# Patient Record
Sex: Male | Born: 1945 | Race: White | Hispanic: No | Smoking: Current every day smoker
Health system: Southern US, Community
[De-identification: ages and names within clinical notes are randomized; demographics above are authoritative.]

## PROBLEM LIST (undated history)

## (undated) DIAGNOSIS — S0990XA Unspecified injury of head, initial encounter: Secondary | ICD-10-CM

## (undated) DIAGNOSIS — J189 Pneumonia, unspecified organism: Secondary | ICD-10-CM

## (undated) DIAGNOSIS — J449 Chronic obstructive pulmonary disease, unspecified: Secondary | ICD-10-CM

## (undated) DIAGNOSIS — N400 Enlarged prostate without lower urinary tract symptoms: Secondary | ICD-10-CM

## (undated) DIAGNOSIS — F419 Anxiety disorder, unspecified: Secondary | ICD-10-CM

## (undated) DIAGNOSIS — I2699 Other pulmonary embolism without acute cor pulmonale: Secondary | ICD-10-CM

## (undated) DIAGNOSIS — I2510 Atherosclerotic heart disease of native coronary artery without angina pectoris: Secondary | ICD-10-CM

## (undated) DIAGNOSIS — D332 Benign neoplasm of brain, unspecified: Secondary | ICD-10-CM

## (undated) DIAGNOSIS — I10 Essential (primary) hypertension: Secondary | ICD-10-CM

## (undated) DIAGNOSIS — F32A Depression, unspecified: Secondary | ICD-10-CM

## (undated) DIAGNOSIS — E119 Type 2 diabetes mellitus without complications: Secondary | ICD-10-CM

## (undated) DIAGNOSIS — H269 Unspecified cataract: Secondary | ICD-10-CM

## (undated) DIAGNOSIS — D649 Anemia, unspecified: Secondary | ICD-10-CM

## (undated) DIAGNOSIS — F329 Major depressive disorder, single episode, unspecified: Secondary | ICD-10-CM

## (undated) HISTORY — DX: Unspecified cataract: H26.9

## (undated) HISTORY — DX: Chronic obstructive pulmonary disease, unspecified: J44.9

## (undated) HISTORY — PX: BACK SURGERY: SHX140

## (undated) HISTORY — DX: Pneumonia, unspecified organism: J18.9

## (undated) HISTORY — PX: HERNIA REPAIR: SHX51

---

## 2000-06-24 ENCOUNTER — Encounter: Admission: RE | Admit: 2000-06-24 | Discharge: 2000-06-24 | Payer: Self-pay | Admitting: Surgery

## 2000-06-24 ENCOUNTER — Encounter: Payer: Self-pay | Admitting: Surgery

## 2000-06-25 ENCOUNTER — Ambulatory Visit (HOSPITAL_BASED_OUTPATIENT_CLINIC_OR_DEPARTMENT_OTHER): Admission: RE | Admit: 2000-06-25 | Discharge: 2000-06-25 | Payer: Self-pay | Admitting: Surgery

## 2001-03-02 ENCOUNTER — Ambulatory Visit (HOSPITAL_COMMUNITY): Admission: RE | Admit: 2001-03-02 | Discharge: 2001-03-02 | Payer: Self-pay | Admitting: Neurosurgery

## 2001-03-02 ENCOUNTER — Encounter: Payer: Self-pay | Admitting: Neurosurgery

## 2002-07-27 ENCOUNTER — Ambulatory Visit (HOSPITAL_COMMUNITY): Admission: RE | Admit: 2002-07-27 | Discharge: 2002-07-27 | Payer: Self-pay | Admitting: Orthopedic Surgery

## 2002-07-27 ENCOUNTER — Encounter: Payer: Self-pay | Admitting: Orthopedic Surgery

## 2002-08-07 ENCOUNTER — Encounter: Payer: Self-pay | Admitting: Emergency Medicine

## 2002-08-07 ENCOUNTER — Observation Stay (HOSPITAL_COMMUNITY): Admission: EM | Admit: 2002-08-07 | Discharge: 2002-08-08 | Payer: Self-pay | Admitting: Emergency Medicine

## 2002-08-08 ENCOUNTER — Encounter: Payer: Self-pay | Admitting: Cardiology

## 2002-08-11 ENCOUNTER — Inpatient Hospital Stay (HOSPITAL_COMMUNITY): Admission: EM | Admit: 2002-08-11 | Discharge: 2002-08-13 | Payer: Self-pay | Admitting: Emergency Medicine

## 2003-04-16 ENCOUNTER — Ambulatory Visit (HOSPITAL_COMMUNITY): Admission: RE | Admit: 2003-04-16 | Discharge: 2003-04-16 | Payer: Self-pay | Admitting: Unknown Physician Specialty

## 2003-04-16 ENCOUNTER — Encounter: Payer: Self-pay | Admitting: Unknown Physician Specialty

## 2004-05-16 ENCOUNTER — Observation Stay (HOSPITAL_COMMUNITY): Admission: EM | Admit: 2004-05-16 | Discharge: 2004-05-17 | Payer: Self-pay | Admitting: Emergency Medicine

## 2004-07-24 ENCOUNTER — Encounter: Admission: RE | Admit: 2004-07-24 | Discharge: 2004-07-24 | Payer: Self-pay | Admitting: Neurosurgery

## 2004-10-31 ENCOUNTER — Ambulatory Visit: Payer: Self-pay | Admitting: Family Medicine

## 2005-01-09 ENCOUNTER — Ambulatory Visit: Payer: Self-pay | Admitting: Family Medicine

## 2005-03-22 ENCOUNTER — Ambulatory Visit: Payer: Self-pay | Admitting: Family Medicine

## 2005-03-29 ENCOUNTER — Ambulatory Visit: Payer: Self-pay | Admitting: Family Medicine

## 2005-04-23 ENCOUNTER — Ambulatory Visit: Payer: Self-pay | Admitting: Family Medicine

## 2005-04-30 ENCOUNTER — Ambulatory Visit: Payer: Self-pay | Admitting: Family Medicine

## 2005-05-08 ENCOUNTER — Ambulatory Visit: Payer: Self-pay | Admitting: Family Medicine

## 2005-05-29 ENCOUNTER — Ambulatory Visit: Payer: Self-pay | Admitting: Family Medicine

## 2005-06-28 ENCOUNTER — Ambulatory Visit: Payer: Self-pay | Admitting: Family Medicine

## 2005-07-06 ENCOUNTER — Ambulatory Visit: Payer: Self-pay | Admitting: Family Medicine

## 2005-09-06 ENCOUNTER — Ambulatory Visit: Payer: Self-pay | Admitting: Family Medicine

## 2005-09-11 ENCOUNTER — Ambulatory Visit: Payer: Self-pay | Admitting: Family Medicine

## 2007-07-28 ENCOUNTER — Emergency Department (HOSPITAL_COMMUNITY): Admission: EM | Admit: 2007-07-28 | Discharge: 2007-07-28 | Payer: Self-pay | Admitting: Emergency Medicine

## 2007-11-21 ENCOUNTER — Emergency Department (HOSPITAL_COMMUNITY): Admission: EM | Admit: 2007-11-21 | Discharge: 2007-11-22 | Payer: Self-pay | Admitting: Emergency Medicine

## 2009-09-14 ENCOUNTER — Encounter: Admission: RE | Admit: 2009-09-14 | Discharge: 2009-12-08 | Payer: Self-pay | Admitting: Physician Assistant

## 2010-11-05 ENCOUNTER — Emergency Department (HOSPITAL_COMMUNITY): Admission: EM | Admit: 2010-11-05 | Discharge: 2010-11-06 | Payer: Self-pay | Admitting: Emergency Medicine

## 2010-12-31 ENCOUNTER — Encounter: Payer: Self-pay | Admitting: Urology

## 2011-04-27 NOTE — H&P (Signed)
NAME:  Rodney Moyer, Rodney Moyer                            ACCOUNT NO.:  [id number]   MEDICAL RECORD NO.:  [id number]                   PATIENT TYPE:  INP   LOCATION:  1831                                 FACILITY:  [facility]   PHYSICIAN:  Charlies Constable, M.D. [facility]              DATE OF BIRTH:  [birth year]   DATE OF ADMISSION:  05/16/2004  DATE OF DISCHARGE:                                HISTORY & PHYSICAL   PRIMARY CARE PHYSICIAN:  Dr. Colon Flattery.   CHIEF COMPLAINT:  Chest pain.   CLINICAL HISTORY:  Rodney Moyer is 65 years old and has a previous history of  nonobstructive coronary disease.  He was evaluated in 2003 for chest pain  with a cardiac catheterization which showed 30% narrowing in a marginal  branch of the circumflex artery with ejection fraction of 60%.  He had slow  flow in one of his arteries and was felt to possibly have microvascular  dysfunction and was treated with Cardizem for this.   He has done well since that time with no recurrent cardiac symptoms, but  today at work, developed recurrent chest pain.  He had not been doing  anything unusually strenuous.  He developed a very sudden onset of sharp  pain in his mid-chest which radiated to his back.  He also had some  radiation to his left arm.  The pain lasted about 4-5 minutes.  He took  nitroglycerin and EMS was called and then he was given nitroglycerin, which  gave him relief.   PAST MEDICAL HISTORY:  His past medical history is significant for:  1. Hyperlipidemia.  2. GERD.  3. Possible obstructive sleep apnea.  4. Previous back surgery.  5. He also has a history of peripheral neuropathy.   ALLERGIES:  He has some allergy in that DYE caused itching.  NOVOCAINE  caused head swelling and CORTISONE caused hiccups.   CURRENT MEDICATIONS:  Current medications include only aspirin and p.r.n.  Tums and vinegar.   SOCIAL HISTORY:  He lives in [place].  He lives by himself but has a  girlfriend.  He is still smoking a pack of  cigarettes per day.   For details of social history, family history and review of systems, please  see complete note by Maple Mirza, P.A.   EXAMINATION:  VITAL SIGNS:  On examination, blood pressure is 117/76 with a  pulse 69 and regular.  NECK:  There was no venous distention.  The carotids were full without  bruits.  CHEST:  Chest was clear.  There were no rales or rhonchi.  CARDIAC:  The cardiac rhythm was regular.  I could hear no murmurs or  gallops.  ABDOMEN:  The abdomen was soft with normal bowel sounds.  There was no  hepatosplenomegaly.  EXTREMITIES:  Extremities showed good pulses and no pedal edema.  MUSCULOSKELETAL:  Musculoskeletal system showed no deformities.  NEUROLOGICAL:  Examination  showed no focal neurological signs.  SKIN:  Skin showed some erythematous rash on the back of his neck but  otherwise was warm and dry.   LABORATORY AND ACCESSORY CLINICAL DATA:  An EKG was normal.   Initial point-of-care markers were negative.   IMPRESSION:  1. Chest pain with some suggestive feature of unstable angina, rule out     acute coronary syndrome.  2. Symptoms of reflux.  3. History of nonobstructive coronary disease at catheterization, September     2003, with possible microvascular dysfunction.  4. Continued cigarette use.  5. Gastroesophageal reflux disease.  6. Possible obstructive sleep apnea.   RECOMMENDATIONS:  We will plan to admit the patient for observation.  If he  has no recurrent pain and if his subsequent ECGs and cardiac markers are  negative, we will plan an outpatient Cardiolite scan later.  We will  initially treat him with aspirin, heparin and low-dose beta blockers.                                                Charlies Constable, M.D. [facility]    BB/MEDQ  D:  05/16/2004  T:  05/17/2004  Job:  [id number]   cc:   Colon Flattery, D.O.  [address]  [place]  [place] [address]  Fax: [phone]

## 2011-04-27 NOTE — Cardiovascular Report (Signed)
NAME:  Rodney Moyer, Rodney Moyer                            ACCOUNT NO.:  [id number]   MEDICAL RECORD NO.:  [id number]                   PATIENT TYPE:  INP   LOCATION:  2038                                 FACILITY:  [facility]   PHYSICIAN:  Madolyn Frieze. Jens Som, M.D. [facility]         DATE OF BIRTH:  [birth year]   DATE OF PROCEDURE:  08/13/2002  DATE OF DISCHARGE:  08/13/2002                              CARDIAC CATHETERIZATION   PROCEDURE PERFORMED:  Cardiac catheterization.   CLINICAL HISTORY:  The patient is 65 years old and recently was hospitalized  with chest pain and had one positive troponin which was felt to probably be  an error.  He had a stress test which was nondiagnostic but did not show  ischemia. He was discharged home and returned with recurrent chest pain and  was seen by Dr. Jens Som in the emergency room and admitted for further  evaluation.   DESCRIPTION OF PROCEDURE:  The procedure was performed via the right femoral  artery using an arterial sheath and 6 French preformed coronary catheters.  A front wall arterial puncture was performed and Omnipaque contrast was  used. A distal aortogram was performed to rule out abdominal aortic  aneurysm. We gave intracoronary verapamil in the right coronary artery to  assess the slow flow in that vessel. The right femoral artery was closed  with Perclose at the end of the procedure. The patient tolerated the  procedure well and left the laboratory in satisfactory condition.   RESULTS:  The left main coronary artery:  The left main coronary artery was  free of significant disease.   Left anterior descending:  The left anterior descending artery gave rise to  two septal perforators and three diagonal branches. The LAD was irregular,  but there was no significant obstruction.   Circumflex artery:  The circumflex artery was a large vessel that gave rise  to a ramus branch, a marginal branch, and three small posterolateral  branches.  There were  irregularities and 30% narrowing in the marginal  branch.   Right coronary artery:  The right coronary is a moderate sized vessel that  gave rise to two right ventricular branches in the posterior descending  branch and the posterolateral branch.  The vessel was smooth without any  irregularities but the flow was slow requiring 4 to 5 beats before filling  of the distal vessel. Following intracoronary verapamil, this improved  fairly dramatically with filling of the distal vessels in less than two  heart beats. 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   LEFT VENTRICULOGRAPHY:  The left ventriculogram performed in the RAO  projection showed good wall motion with no areas of hypokinesis.  The  estimated ejection fraction was 60%.   DISTAL AORTOGRAM:  A distal aortogram was performed which showed no  significant aortoiliac obstruction.   The aortic pressure is 109/58 and left ventricular pressure is 109/21.  CONCLUSION:  1. Nonobstructive coronary artery disease.  2. Slow flow in the right coronary artery normalized with intracoronary     verapamil, probably indicative of endothelial dysfunction.   RECOMMENDATIONS:  The etiology of his chest pain is not clear, but it is  possible it could be related to endothelial dysfunction with slow flow of  the right artery which normalizes verapamil. We will give the patient a  trial of calcium channel blockers and treat him Cardizem CD 180 a day, and  possibly we will need to increase this dose later.  He also has said that  the reflux and his symptoms could be related to that and we will switch him  from Pepcid to a proton pump inhibitor. We will plan discharge today with  followup with Dr. Dewaine Conger next week.  We will make followup with cardiology  on a p.r.n. basis from Dr. Dewaine Conger.        Bruce Elvera Lennox Juanda Chance, M.D. [facility]                 Madolyn Frieze. Jens Som, M.D. [facility]    BRB/MEDQ  D:  08/13/2002  T:   08/14/2002  Job:  [id number]   cc:   Colon Flattery  [address]  [place]  [place] [address]  Fax: [phone]   Madolyn Frieze. Jens Som, M.D. [facility]iop[facility]

## 2011-04-27 NOTE — Op Note (Signed)
[place]  Patient:    Rodney Moyer, Rodney Moyer                      MRN: [id number] Proc. Date: 06/25/00 Adm. Date:  [id number] Attending:  Katha Cabal CC:         Colon Flattery, D.O.                           Operative Report  CCS# [id number]  PREOPERATIVE DIAGNOSIS:  Umbilical hernia.  POSTOPERATIVE DIAGNOSIS:  Umbilical hernia.  OPERATION PERFORMED:  Umbilical herniorrhaphy (no mess).  SURGEON:  Thornton Park. Daphine Deutscher, M.D.  ASSISTANT:  ANESTHESIA:  General by LMA.  INDICATIONS FOR PROCEDURE:  The patient is a 65 year old gentleman who had noticed discomfort and pain in his umbilicus and felt a little bulging mass there that was quite tender.  He had a soft area that you could feel in the depths of his umbilicus that would pop in and out consistent with a small umbilical hernia.  DESCRIPTION OF PROCEDURE:  He was taken to operating room 3 and given general by LMA and the abdomen was shaved and prepped with Betadine and draped sterilely.  A small infraumbilical incision was made and the umbilicus was elevated off this hernia sac.  This contained properitoneum which was herniated through a defect about the size of a dime.  After freeing this [place] completely and I freed up the edges of the fascia superiorly, inferiorly and laterally, I went ahead and placed five sutures of 0 Prolene that completely closed this hole.  This area was then injected with 0.5% Marcaine.  The hernia had been reduced and the edges which had been freed up were closed primarily transversely.  The umbilical skin was tacked down to the fascia and then this wound was closed with 4-0 Vicryl subcutaneously with benzoin and Steri-Strips.  The patient tolerated the procedure well and was taken to the recovery room in satisfactory condition.  He will be given Percocet to take for pain and will be followed up in the office in three weeks. DD:  06/25/00 TD:  06/25/00 Job:  [id number]

## 2011-04-27 NOTE — Discharge Summary (Signed)
NAME:  Rodney Moyer, Rodney Moyer                            ACCOUNT NO.:  [id number]   MEDICAL RECORD NO.:  [id number]                   PATIENT TYPE:  INP   LOCATION:  3743                                 FACILITY:  [facility]   PHYSICIAN:  Charlies Constable, M.D. [facility]              DATE OF BIRTH:  05-17-1946   DATE OF ADMISSION:  05/16/2004  DATE OF DISCHARGE:  05/17/2004                           DISCHARGE SUMMARY - REFERRING   PROCEDURE:  Adenosine Cardiolite June 8.   REASON FOR ADMISSION:  Please refer to dictated admission note.   LABORATORY DATA:  Normal cardiac enzymes. Normal electrolytes and renal  function. Glucose 139 on admission. TSH 0.34. Normal CBC. Lipid profile  pending.   Admission chest x-ray:  No acute changes.   Chest CT (noncontrast):  Unremarkable.   HOSPITAL COURSE:  The patient ruled out for myocardial infarction with all  serial cardiac markers within normal limits. The patient also had a  noncontrast chest CT while in the emergency room which was unremarkable.  Serial EKGs revealed no acute changes.   The patient presented with history of gastroesophageal reflux disease but  was not on any medications prior to admission. He was placed on Protonix.   Following the negative cardiac markers, the patient underwent adenosine  Cardiolite testing which revealed no perfusion abnormalities and normal left  ventricular function.   No further cardiac workup was recommended.   At discharge, the patient was instructed to follow up with his primary care  physician for further evaluation of gastroesophageal reflux disease. He will  go home on a protein pump inhibitor. He was also strongly encouraged to stop  smoking tobacco. Of note, he was also advised to cut back on aspirin from  full dose to 81 mg q.d.   DISCHARGE MEDICATIONS:  1. Protonix 40 mg q.d. (new).  2. Coated aspirin 81 mg q.d.   INSTRUCTIONS:  Arrange followup with Dr. Colon Flattery in the following one  to two  weeks.   DISCHARGE DIAGNOSES:  1. Nonischemic chest pain.     A. Normal serial cardiac markers.     B. Normal adenosine Cardiolite June 8.  2. Nonobstructive coronary artery disease. Cardiac catheterization September     2003.  3. Tobacco.  4. History of dyslipidemia.  5. Gastroesophageal reflux disease.      Gene Serpe, P.A. [facility]                      Charlies Constable, M.D. [facility]    GS/MEDQ  D:  05/17/2004  T:  05/18/2004  Job:  [id number]   cc:   Colon Flattery, D.O.  [address]  [place]  [place] [address]  Fax: [phone]

## 2011-09-17 LAB — URINALYSIS, ROUTINE W REFLEX MICROSCOPIC
Bilirubin Urine: NEGATIVE
Leukocytes, UA: NEGATIVE
Nitrite: NEGATIVE
Protein, ur: NEGATIVE
Urobilinogen, UA: 0.2
pH: 6.5

## 2011-09-17 LAB — URINE MICROSCOPIC-ADD ON

## 2013-12-10 HISTORY — PX: CARDIAC CATHETERIZATION: SHX172

## 2014-12-10 HISTORY — PX: ROTATOR CUFF REPAIR: SHX139

## 2014-12-13 ENCOUNTER — Other Ambulatory Visit: Payer: Self-pay | Admitting: Orthopaedic Surgery

## 2014-12-13 DIAGNOSIS — M25512 Pain in left shoulder: Secondary | ICD-10-CM

## 2014-12-21 ENCOUNTER — Ambulatory Visit
Admission: RE | Admit: 2014-12-21 | Discharge: 2014-12-21 | Disposition: A | Discharge status: Home / Self Care | Payer: Medicare Other | Source: Ambulatory Visit | Attending: Orthopaedic Surgery | Admitting: Orthopaedic Surgery

## 2014-12-21 DIAGNOSIS — M25512 Pain in left shoulder: Secondary | ICD-10-CM

## 2014-12-21 MED ORDER — IOHEXOL 180 MG/ML  SOLN
15.0000 mL | Freq: Once | INTRAMUSCULAR | Status: AC | PRN
  Administered 2014-12-21: 15 mL via INTRA_ARTICULAR

## 2015-01-20 ENCOUNTER — Ambulatory Visit: Payer: Medicare Other | Attending: Orthopaedic Surgery | Admitting: Physical Therapy

## 2015-01-20 DIAGNOSIS — M25512 Pain in left shoulder: Secondary | ICD-10-CM | POA: Insufficient documentation

## 2015-01-20 DIAGNOSIS — M25612 Stiffness of left shoulder, not elsewhere classified: Secondary | ICD-10-CM | POA: Diagnosis not present

## 2015-01-27 ENCOUNTER — Ambulatory Visit: Payer: Medicare Other | Admitting: *Deleted

## 2015-01-27 DIAGNOSIS — M25512 Pain in left shoulder: Secondary | ICD-10-CM | POA: Diagnosis not present

## 2015-01-27 DIAGNOSIS — M25612 Stiffness of left shoulder, not elsewhere classified: Secondary | ICD-10-CM

## 2015-01-27 NOTE — Therapy (Signed)
[facility] [place], [place], [address] Phone: [phone]   Fax:  [phone]  Physical Therapy Treatment  Patient Details  Name: Rodney Moyer MRN: [id number] Date of Birth: 05-16-1946 Referring Provider:  Garald Balding, MD  Encounter Date: 01/27/2015      PT End of Session - 01/27/15 1656    Visit Number 2   Number of Visits 16   Date for PT Re-Evaluation 03/21/15   PT Start Time 1600   PT Stop Time 1649   PT Time Calculation (min) 49 min      No past medical history on file.  No past surgical history on file.  There were no vitals taken for this visit.  Visit Diagnosis:  Left shoulder pain  Shoulder stiffness, left      Subjective Assessment - 01/27/15 1642    Symptoms pt. continues to have pain in LT shldr. Doing ex.s at home   Currently in Pain? Yes   Pain Score 7    Pain Location Shoulder   Pain Orientation Left   Pain Descriptors / Indicators Aching;Burning                    [facility] Adult PT Treatment/Exercise - 01/27/15 0001    Modalities   Modalities Cryotherapy;Electrical Stimulation   Cryotherapy   Number Minutes Cryotherapy 15 Minutes   Cryotherapy Location Shoulder   Type of Cryotherapy Ice pack   Electrical Stimulation   Electrical Stimulation Location LT shldr   Electrical Stimulation Action premod   Manual Therapy   Manual Therapy Passive ROM   Passive ROM PROM for flexion and er with low load end-range holds with pt. supine                  PT Short Term Goals - 01/27/15 1702    PT SHORT TERM GOAL #1   Title be independent with initial HEP   Status On-going           PT Long Term Goals - 01/27/15 1704    PT LONG TERM GOAL #1   Title demonstrate and/or verbalize techniques to reduce the risk of re-injury to include info on: anti-infammatory (RICE method).   Time 8   Period Weeks   Status On-going   PT LONG TERM GOAL #2   Title be independent  with advanced HEP   Time 8   Period Weeks   Status On-going   PT LONG TERM GOAL #3   Title increase ROM with active left shoulder flexion to 150 degrees so the patient can easily reach overhead   Time 8   Period Weeks   Status On-going   PT LONG TERM GOAL #4   Title increase ROM with active ER to 75 degrees+ to allow for easily donning/doffing of apparel   Time 8   Period Weeks   Status On-going   PT LONG TERM GOAL #5   Title increase ROM so patient is able to reach behind back to 75 degrees   Time 8   Period Weeks   Status On-going   Additional Long Term Goals   Additional Long Term Goals Yes   PT LONG TERM GOAL #6   Title increase L shoulder strength to a solid 4+/5 to increase stability for performance of functional activities   Time 8   Period Weeks   Status On-going   PT LONG TERM GOAL #7   Title perform ADL's with pain not >3/10  Time 8   Period Weeks   Status On-going               Plan - 01/27/15 1658    Clinical Impression Statement pt. did fairly well,but still has a lot of pain and is guarding still   PT Treatment/Interventions Moist Heat;Patient/family education;Therapeutic exercise;Passive range of motion;Manual techniques;Cryotherapy;Electrical Stimulation   PT Next Visit Plan cont with LT shldr PROM        Problem List There are no active problems to display for this patient.   RAMSEUR,CHRIS 01/27/2015, 5:24 PM  [facility] [address] [place], [place], [address] Phone: [phone]   Fax:  [phone]

## 2015-02-01 ENCOUNTER — Encounter: Payer: Self-pay | Admitting: Physical Therapy

## 2015-02-01 ENCOUNTER — Ambulatory Visit: Payer: Medicare Other | Admitting: Physical Therapy

## 2015-02-01 DIAGNOSIS — M25612 Stiffness of left shoulder, not elsewhere classified: Secondary | ICD-10-CM

## 2015-02-01 DIAGNOSIS — M25512 Pain in left shoulder: Secondary | ICD-10-CM

## 2015-02-01 NOTE — Therapy (Signed)
[facility] [place], [place], [address] Phone: [phone]   Fax:  [phone]  Physical Therapy Treatment  Patient Details  Name: Rodney Moyer MRN: [id number] Date of Birth: 1946-11-22 Referring Provider:  Garald Balding, MD  Encounter Date: 02/01/2015      PT End of Session - 02/01/15 1113    Visit Number 3   Number of Visits 16   Date for PT Re-Evaluation 03/21/15   PT Start Time 1013   PT Stop Time 1100   PT Time Calculation (min) 47 min      History reviewed. No pertinent past medical history.  History reviewed. No pertinent past surgical history.  There were no vitals taken for this visit.  Visit Diagnosis:  Left shoulder pain  Shoulder stiffness, left      Subjective Assessment - 02/01/15 1102    Symptoms pt has a lot of pain after sneezing on sunday night thus cauing jerking movement then instant pain   Currently in Pain? Yes   Pain Score 9    Pain Location Shoulder   Pain Orientation Left   Pain Descriptors / Indicators Stabbing   Pain Type Surgical pain   Aggravating Factors  PROM   Pain Relieving Factors ice/rest          [facility] PT Assessment - 02/01/15 0001    ROM / Strength   AROM / PROM / Strength PROM   PROM   Overall PROM  Deficits   Overall PROM Comments Initial 20ER/70Flexion   PROM Assessment Site Shoulder   Right/Left Shoulder Left   Left Shoulder Flexion 90 Degrees   Left Shoulder External Rotation 30 Degrees                  [facility] Adult PT Treatment/Exercise - 02/01/15 0001    Modalities   Modalities Cryotherapy;Electrical Stimulation   Cryotherapy   Number Minutes Cryotherapy 15 Minutes   Cryotherapy Location Shoulder   Type of Cryotherapy Ice pack   Electrical Stimulation   Electrical Stimulation Location Shoulder Left   Electrical Stimulation Action premod   Electrical Stimulation Parameters x 15 min   Electrical Stimulation Goals Pain   Manual  Therapy   Manual Therapy Passive ROM   Passive ROM Low load movements for flexion/er, pt very guarded and required cues to relax                PT Education - 02/01/15 1111    Education provided Yes   Education Details RICE METHOD/Follow MD orders per protocol   Person(s) Educated Patient   Methods Explanation;Demonstration   Comprehension Verbalized understanding;Returned demonstration          PT Short Term Goals - 01/27/15 1702    PT SHORT TERM GOAL #1   Title be independent with initial HEP   Status On-going           PT Long Term Goals - 02/01/15 1123    PT LONG TERM GOAL #1   Title demonstrate and/or verbalize techniques to reduce the risk of re-injury to include info on: anti-infammatory (RICE method).   Time 8   Period Weeks   Status Achieved   PT LONG TERM GOAL #2   Title be independent with advanced HEP   Time 8   Period Weeks   Status On-going   PT LONG TERM GOAL #3   Title increase ROM with active left shoulder flexion to 150 degrees so the patient can easily reach  overhead   Time 8   Period Weeks   Status On-going   PT LONG TERM GOAL #4   Title increase ROM with active ER to 75 degrees+ to allow for easily donning/doffing of apparel   Time 8   Period Weeks   Status On-going   PT LONG TERM GOAL #5   Title increase ROM so patient is able to reach behind back to 75 degrees   Time 8   Period Weeks   Status On-going   PT LONG TERM GOAL #6   Title increase L shoulder strength to a solid 4+/5 to increase stability for performance of functional activities   Time 8   Period Weeks   Status On-going   PT LONG TERM GOAL #7   Title perform ADL's with pain not >3/10   Time 8   Period Weeks   Status On-going               Plan - 02/01/15 1118    Clinical Impression Statement Pt tolerated tx well today, very guarded throughout. Pt understands RICE method and was educated on not using arm actively. Met LTG #1 others ongoing   PT Next  Visit Plan Cont with POC for PROM per MPT / going to MD and will progress accordingly        Problem List There are no active problems to display for this patient.   Phillips Climes, PTA 02/01/2015, 11:27 AM  [facility] [address] [place], [place], [address] Phone: [phone]   Fax:  [phone]

## 2015-02-08 ENCOUNTER — Ambulatory Visit: Payer: Medicare Other | Attending: Orthopaedic Surgery | Admitting: *Deleted

## 2015-02-08 ENCOUNTER — Encounter: Payer: Self-pay | Admitting: *Deleted

## 2015-02-08 DIAGNOSIS — M25512 Pain in left shoulder: Secondary | ICD-10-CM | POA: Diagnosis present

## 2015-02-08 DIAGNOSIS — M25612 Stiffness of left shoulder, not elsewhere classified: Secondary | ICD-10-CM | POA: Insufficient documentation

## 2015-02-08 NOTE — Therapy (Signed)
[facility] [place], [place], [address] Phone: [phone]   Fax:  [phone]  Physical Therapy Treatment  Patient Details  Name: Rodney Moyer MRN: [id number] Date of Birth: 1946/08/23 Referring Provider:  Garald Balding, MD  Encounter Date: 02/08/2015      PT End of Session - 02/08/15 1426    Visit Number 4   Number of Visits 16   Date for PT Re-Evaluation 03/21/15   PT Start Time 1583      History reviewed. No pertinent past medical history.  History reviewed. No pertinent past surgical history.  There were no vitals taken for this visit.  Visit Diagnosis:  Left shoulder pain  Shoulder stiffness, left      Subjective Assessment - 02/08/15 1349    Symptoms my LT shoulderkills me 8/10 pain   Limitations Lifting;House hold activities   Pain Score 8    Pain Location Shoulder   Pain Orientation Left   Pain Descriptors / Indicators Aching;Burning;Sore;Sharp   Aggravating Factors  certain pos.'s.  can't sleep   Pain Relieving Factors meds, ice                    [facility] Adult PT Treatment/Exercise - 02/08/15 0001    Cryotherapy   Number Minutes Cryotherapy 15 Minutes   Cryotherapy Location Shoulder   Type of Cryotherapy Ice pack   Electrical Stimulation   Electrical Stimulation Location Shoulder Left   Electrical Stimulation Action premod   Electrical Stimulation Parameters x 15 mins   Electrical Stimulation Goals Pain   Manual Therapy   Manual Therapy Passive ROM   Passive ROM Low load movements for flexion/er, pt less guarded today, but still required cues to relax  LT shldr  ROM for flexion 130 degrees and er to 50                  PT Short Term Goals - 01/27/15 1702    PT SHORT TERM GOAL #1   Title be independent with initial HEP   Status On-going    MET 02-08-15       PT Long Term Goals - 02/01/15 1123    PT LONG TERM GOAL #1   Title demonstrate and/or verbalize  techniques to reduce the risk of re-injury to include info on: anti-infammatory (RICE method).   Time 8   Period Weeks   Status Achieved   PT LONG TERM GOAL #2   Title be independent with advanced HEP   Time 8   Period Weeks   Status On-going   PT LONG TERM GOAL #3   Title increase ROM with active left shoulder flexion to 150 degrees so the patient can easily reach overhead   Time 8   Period Weeks   Status On-going   PT LONG TERM GOAL #4   Title increase ROM with active ER to 75 degrees+ to allow for easily donning/doffing of apparel   Time 8   Period Weeks   Status On-going   PT LONG TERM GOAL #5   Title increase ROM so patient is able to reach behind back to 75 degrees   Time 8   Period Weeks   Status On-going   PT LONG TERM GOAL #6   Title increase L shoulder strength to a solid 4+/5 to increase stability for performance of functional activities   Time 8   Period Weeks   Status On-going   PT LONG TERM GOAL #7  Title perform ADL's with pain not >3/10   Time 8   Period Weeks   Status On-going               Plan - 02/08/15 1428    Clinical Impression Statement Pt did better today , but cont.s to have pain in LT shldr that is close to7-8/10 and disturbs his sleep and ADLs   PT Next Visit Plan cont with PROM   Consulted and Agree with Plan of Care Patient        Problem List There are no active problems to display for this patient.   RAMSEUR,CHRIS, PTA 02/08/2015, 3:58 PM  [facility] [address] [place], [place], [address] Phone: [phone]   Fax:  [phone]

## 2015-02-15 ENCOUNTER — Encounter: Payer: Medicare Other | Admitting: Physical Therapy

## 2015-02-18 ENCOUNTER — Encounter: Payer: Self-pay | Admitting: Physical Therapy

## 2015-02-18 ENCOUNTER — Ambulatory Visit: Payer: Medicare Other | Admitting: Physical Therapy

## 2015-02-18 DIAGNOSIS — M25512 Pain in left shoulder: Secondary | ICD-10-CM | POA: Diagnosis not present

## 2015-02-18 DIAGNOSIS — M25612 Stiffness of left shoulder, not elsewhere classified: Secondary | ICD-10-CM

## 2015-02-18 NOTE — Therapy (Signed)
[facility] [place], [place], [address] Phone: [phone]   Fax:  [phone]  Physical Therapy Treatment  Patient Details  Name: Rodney Moyer MRN: [id number] Date of Birth: Feb 28, 1946 Referring Provider:  Garald Balding, MD  Encounter Date: 02/18/2015      PT End of Session - 02/18/15 1056    Visit Number 5   Number of Visits 16   Date for PT Re-Evaluation 03/21/15   PT Start Time 1031   PT Stop Time 1116   PT Time Calculation (min) 45 min   Activity Tolerance Patient tolerated treatment well   Behavior During Therapy [facility] for tasks assessed/performed      History reviewed. No pertinent past medical history.  History reviewed. No pertinent past surgical history.  There were no vitals filed for this visit.  Visit Diagnosis:  Left shoulder pain  Shoulder stiffness, left      Subjective Assessment - 02/18/15 1034    Symptoms still have difficulty sleeping   Currently in Pain? Yes   Pain Score 5    Pain Location Shoulder   Pain Orientation Left   Pain Descriptors / Indicators Sore;Shooting;Aching            [facility] PT Assessment - 02/18/15 0001    ROM / Strength   AROM / PROM / Strength AROM;PROM   PROM   Overall PROM  Deficits   PROM Assessment Site Shoulder   Right/Left Shoulder Left   Left Shoulder Flexion 135 Degrees   Left Shoulder External Rotation 70 Degrees                   [facility] Adult PT Treatment/Exercise - 02/18/15 0001    Modalities   Modalities Cryotherapy;Electrical Stimulation   Cryotherapy   Number Minutes Cryotherapy 15 Minutes   Cryotherapy Location Shoulder   Type of Cryotherapy Ice pack   Electrical Stimulation   Electrical Stimulation Location Shoulder Left   Electrical Stimulation Action premod   Electrical Stimulation Parameters 15 min   Electrical Stimulation Goals Pain   Manual Therapy   Manual Therapy Passive ROM   Passive ROM Low load movements  for flexion/er, pt less guarded today, but still required cues to relax                  PT Short Term Goals - 01/27/15 1702    PT SHORT TERM GOAL #1   Title be independent with initial HEP   Status On-going           PT Long Term Goals - 02/01/15 1123    PT LONG TERM GOAL #1   Title demonstrate and/or verbalize techniques to reduce the risk of re-injury to include info on: anti-infammatory (RICE method).   Time 8   Period Weeks   Status Achieved   PT LONG TERM GOAL #2   Title be independent with advanced HEP   Time 8   Period Weeks   Status On-going   PT LONG TERM GOAL #3   Title increase ROM with active left shoulder flexion to 150 degrees so the patient can easily reach overhead   Time 8   Period Weeks   Status On-going   PT LONG TERM GOAL #4   Title increase ROM with active ER to 75 degrees+ to allow for easily donning/doffing of apparel   Time 8   Period Weeks   Status On-going   PT LONG TERM GOAL #5   Title increase  ROM so patient is able to reach behind back to 75 degrees   Time 8   Period Weeks   Status On-going   PT LONG TERM GOAL #6   Title increase L shoulder strength to a solid 4+/5 to increase stability for performance of functional activities   Time 8   Period Weeks   Status On-going   PT LONG TERM GOAL #7   Title perform ADL's with pain not >3/10   Time 8   Period Weeks   Status On-going               Plan - 02/18/15 1058    Clinical Impression Statement pt tolerated tx with no incresed pain. improved PROM today. Goals ongoing   PT Treatment/Interventions Moist Heat;Patient/family education;Therapeutic exercise;Passive range of motion;Manual techniques;Cryotherapy;Electrical Stimulation   PT Next Visit Plan cont with PROM   Consulted and Agree with Plan of Care Patient        Problem List There are no active problems to display for this patient.   Phillips Climes, PTA 02/18/2015, 11:21 AM  [facility] [address] [place], [place], [address] Phone: [phone]   Fax:  [phone]

## 2015-02-23 ENCOUNTER — Ambulatory Visit: Payer: Medicare Other | Admitting: Physical Therapy

## 2015-02-23 ENCOUNTER — Encounter: Payer: Self-pay | Admitting: Physical Therapy

## 2015-02-23 DIAGNOSIS — M25512 Pain in left shoulder: Secondary | ICD-10-CM

## 2015-02-23 DIAGNOSIS — M25612 Stiffness of left shoulder, not elsewhere classified: Secondary | ICD-10-CM

## 2015-02-23 NOTE — Therapy (Signed)
[facility] [place], [place], [address] Phone: [phone]   Fax:  [phone]  Physical Therapy Treatment  Patient Details  Name: Rodney Moyer MRN: [id number] Date of Birth: Jan 17, 1946 Referring Provider:  Garald Balding, MD  Encounter Date: 02/23/2015      PT End of Session - 02/23/15 1252    Visit Number 6   Number of Visits 16   Date for PT Re-Evaluation 03/21/15   PT Start Time 1229   PT Stop Time 1312   PT Time Calculation (min) 43 min   Activity Tolerance Patient tolerated treatment well   Behavior During Therapy [facility] for tasks assessed/performed      History reviewed. No pertinent past medical history.  History reviewed. No pertinent past surgical history.  There were no vitals filed for this visit.  Visit Diagnosis:  Left shoulder pain  Shoulder stiffness, left      Subjective Assessment - 02/23/15 1232    Symptoms shoulder feeling the same with soreness esp with sleeping   Limitations Lifting;House hold activities   Currently in Pain? Yes   Pain Score 5    Pain Location Shoulder   Pain Orientation Left   Pain Descriptors / Indicators Throbbing;Sore   Pain Type Surgical pain   Aggravating Factors  sleeping/ movement   Pain Relieving Factors rest            [facility] PT Assessment - 02/23/15 0001    PROM   PROM Assessment Site Shoulder   Right/Left Shoulder Left   Left Shoulder Flexion 137 Degrees   Left Shoulder External Rotation 70 Degrees                   [facility] Adult PT Treatment/Exercise - 02/23/15 0001    Modalities   Modalities Cryotherapy   Cryotherapy   Number Minutes Cryotherapy 15 Minutes   Cryotherapy Location Shoulder   Type of Cryotherapy Ice pack   Electrical Stimulation   Electrical Stimulation Location Shoulder Left   Electrical Stimulation Parameters premod  x7min   Electrical Stimulation Goals Pain   Manual Therapy   Manual Therapy Passive ROM    Passive ROM Low load movements for flexion/er, pt less guarded today, but still required cues to relax                  PT Short Term Goals - 01/27/15 1702    PT SHORT TERM GOAL #1   Title be independent with initial HEP   Status On-going           PT Long Term Goals - 02/01/15 1123    PT LONG TERM GOAL #1   Title demonstrate and/or verbalize techniques to reduce the risk of re-injury to include info on: anti-infammatory (RICE method).   Time 8   Period Weeks   Status Achieved   PT LONG TERM GOAL #2   Title be independent with advanced HEP   Time 8   Period Weeks   Status On-going   PT LONG TERM GOAL #3   Title increase ROM with active left shoulder flexion to 150 degrees so the patient can easily reach overhead   Time 8   Period Weeks   Status On-going   PT LONG TERM GOAL #4   Title increase ROM with active ER to 75 degrees+ to allow for easily donning/doffing of apparel   Time 8   Period Weeks   Status On-going   PT LONG TERM GOAL #  5   Title increase ROM so patient is able to reach behind back to 75 degrees   Time 8   Period Weeks   Status On-going   PT LONG TERM GOAL #6   Title increase L shoulder strength to a solid 4+/5 to increase stability for performance of functional activities   Time 8   Period Weeks   Status On-going   PT LONG TERM GOAL #7   Title perform ADL's with pain not >3/10   Time 8   Period Weeks   Status On-going               Plan - 02/23/15 1255    Clinical Impression Statement pt continues to progress with PROM, no incresed pain during tx. goals ongoing.   PT Treatment/Interventions Moist Heat;Patient/family education;Therapeutic exercise;Passive range of motion;Manual techniques;Cryotherapy;Electrical Stimulation   PT Next Visit Plan cont with PROM   Consulted and Agree with Plan of Care Patient        Problem List There are no active problems to display for this patient.   Phillips Climes,  PTA 02/23/2015, 1:28 PM  [facility] [address] [place], [place], [address] Phone: [phone]   Fax:  [phone]

## 2015-03-02 ENCOUNTER — Encounter: Payer: Self-pay | Admitting: Physical Therapy

## 2015-03-02 ENCOUNTER — Ambulatory Visit: Payer: Medicare Other | Admitting: Physical Therapy

## 2015-03-02 DIAGNOSIS — M25512 Pain in left shoulder: Secondary | ICD-10-CM

## 2015-03-02 DIAGNOSIS — M25612 Stiffness of left shoulder, not elsewhere classified: Secondary | ICD-10-CM

## 2015-03-02 NOTE — Therapy (Signed)
[facility] [place], [place], [address] Phone: [phone]   Fax:  [phone]  Physical Therapy Treatment  Patient Details  Name: Rodney Moyer MRN: [id number] Date of Birth: 1946-05-14 Referring Provider:  Garald Balding, MD  Encounter Date: 03/02/2015      PT End of Session - 03/02/15 1235    Visit Number 7   Number of Visits 16   Date for PT Re-Evaluation 03/21/15   PT Start Time 1208   PT Stop Time 1249   PT Time Calculation (min) 41 min   Activity Tolerance Patient tolerated treatment well   Behavior During Therapy [facility] for tasks assessed/performed      History reviewed. No pertinent past medical history.  History reviewed. No pertinent past surgical history.  There were no vitals filed for this visit.  Visit Diagnosis:  Left shoulder pain  Shoulder stiffness, left      Subjective Assessment - 03/02/15 1213    Symptoms sore today   Currently in Pain? Yes   Pain Score 6    Pain Location Shoulder   Pain Orientation Left   Pain Descriptors / Indicators Sore   Aggravating Factors  ROM activities   Pain Relieving Factors rest            [facility] PT Assessment - 03/02/15 0001    PROM   PROM Assessment Site Shoulder   Right/Left Shoulder Left   Left Shoulder Flexion 140 Degrees   Left Shoulder External Rotation 72 Degrees                   [facility] Adult PT Treatment/Exercise - 03/02/15 0001    Modalities   Modalities Cryotherapy   Cryotherapy   Number Minutes Cryotherapy 15 Minutes   Cryotherapy Location Shoulder   Type of Cryotherapy Ice pack   Electrical Stimulation   Electrical Stimulation Location Shoulder Left   Electrical Stimulation Parameters premod  x31min   Electrical Stimulation Goals Pain   Manual Therapy   Manual Therapy Passive ROM   Passive ROM Low load movements for flexion/er, pt less guarded today, but still required cues to relax                   PT Short Term Goals - 01/27/15 1702    PT SHORT TERM GOAL #1   Title be independent with initial HEP   Status On-going           PT Long Term Goals - 02/01/15 1123    PT LONG TERM GOAL #1   Title demonstrate and/or verbalize techniques to reduce the risk of re-injury to include info on: anti-infammatory (RICE method).   Time 8   Period Weeks   Status Achieved   PT LONG TERM GOAL #2   Title be independent with advanced HEP   Time 8   Period Weeks   Status On-going   PT LONG TERM GOAL #3   Title increase ROM with active left shoulder flexion to 150 degrees so the patient can easily reach overhead   Time 8   Period Weeks   Status On-going   PT LONG TERM GOAL #4   Title increase ROM with active ER to 75 degrees+ to allow for easily donning/doffing of apparel   Time 8   Period Weeks   Status On-going   PT LONG TERM GOAL #5   Title increase ROM so patient is able to reach behind back to 75 degrees  Time 8   Period Weeks   Status On-going   PT LONG TERM GOAL #6   Title increase L shoulder strength to a solid 4+/5 to increase stability for performance of functional activities   Time 8   Period Weeks   Status On-going   PT LONG TERM GOAL #7   Title perform ADL's with pain not >3/10   Time 8   Period Weeks   Status On-going               Plan - 03/02/15 1238    Clinical Impression Statement pt continues to progress with all activity, no pain increase with ROM and improved PROM. Goals ongoing   PT Treatment/Interventions Moist Heat;Patient/family education;Therapeutic exercise;Passive range of motion;Manual techniques;Cryotherapy;Electrical Stimulation   PT Next Visit Plan cont with PROM   Consulted and Agree with Plan of Care Patient        Problem List There are no active problems to display for this patient.   Phillips Climes, PTA 03/02/2015, 12:51 PM  [facility] [address] [place], [place],  [address] Phone: [phone]   Fax:  [phone]

## 2015-03-09 ENCOUNTER — Ambulatory Visit: Payer: Medicare Other | Admitting: Physical Therapy

## 2015-03-09 ENCOUNTER — Encounter: Payer: Self-pay | Admitting: Physical Therapy

## 2015-03-09 DIAGNOSIS — M25612 Stiffness of left shoulder, not elsewhere classified: Secondary | ICD-10-CM

## 2015-03-09 DIAGNOSIS — M25512 Pain in left shoulder: Secondary | ICD-10-CM | POA: Diagnosis not present

## 2015-03-09 NOTE — Therapy (Signed)
[facility] [place], [place], [address] Phone: [phone]   Fax:  [phone]  Physical Therapy Treatment  Patient Details  Name: Rodney Moyer MRN: [id number] Date of Birth: Dec 25, 1945 Referring Provider:  Garald Balding, MD  Encounter Date: 03/09/2015      PT End of Session - 03/09/15 1304    Visit Number 8   Number of Visits 16   Date for PT Re-Evaluation 03/21/15   PT Start Time 1301   PT Stop Time 1401   PT Time Calculation (min) 60 min      No past medical history on file.  No past surgical history on file.  There were no vitals filed for this visit.  Visit Diagnosis:  Left shoulder pain  Shoulder stiffness, left      Subjective Assessment - 03/09/15 1303    Symptoms Patient states that when he goes into row formation that his arm begins to hurt. Had sling donned at appointment. Reports HEP compliance.   Limitations Lifting;House hold activities   Currently in Pain? Yes   Pain Score 4    Pain Location Shoulder   Pain Orientation Left   Pain Descriptors / Indicators Sharp   Pain Type Surgical pain                       [facility] Adult PT Treatment/Exercise - 03/09/15 0001    Modalities   Modalities Cryotherapy;Electrical Stimulation   Cryotherapy   Number Minutes Cryotherapy 15 Minutes   Cryotherapy Location Shoulder   Type of Cryotherapy Ice pack   Electrical Stimulation   Electrical Stimulation Location Shoulder Left   Electrical Stimulation Action Pre-mod   Electrical Stimulation Parameters 80-150 hz x 15 minutes   Electrical Stimulation Goals Pain   Manual Therapy   Manual Therapy Passive ROM   Passive ROM L shoulder PROM into flexion/scaption/ER/IR. Low load end range holds.  Required oscillations to relax shoulder.                  PT Short Term Goals - 01/27/15 1702    PT SHORT TERM GOAL #1   Title be independent with initial HEP   Status On-going            PT Long Term Goals - 02/01/15 1123    PT LONG TERM GOAL #1   Title demonstrate and/or verbalize techniques to reduce the risk of re-injury to include info on: anti-infammatory (RICE method).   Time 8   Period Weeks   Status Achieved   PT LONG TERM GOAL #2   Title be independent with advanced HEP   Time 8   Period Weeks   Status On-going   PT LONG TERM GOAL #3   Title increase ROM with active left shoulder flexion to 150 degrees so the patient can easily reach overhead   Time 8   Period Weeks   Status On-going   PT LONG TERM GOAL #4   Title increase ROM with active ER to 75 degrees+ to allow for easily donning/doffing of apparel   Time 8   Period Weeks   Status On-going   PT LONG TERM GOAL #5   Title increase ROM so patient is able to reach behind back to 75 degrees   Time 8   Period Weeks   Status On-going   PT LONG TERM GOAL #6   Title increase L shoulder strength to a solid 4+/5 to increase stability for  performance of functional activities   Time 8   Period Weeks   Status On-going   PT LONG TERM GOAL #7   Title perform ADL's with pain not >3/10   Time 8   Period Weeks   Status On-going               Plan - 03/09/15 1349    Clinical Impression Statement Patient tolerated treatment well. Tight end feels noted during PROM. Patient experienced 3/10 pain in L shoulder following treatment. All goals remain on-going.   PT Treatment/Interventions Moist Heat;Patient/family education;Therapeutic exercise;Passive range of motion;Manual techniques;Cryotherapy;Electrical Stimulation   PT Next Visit Plan Continue with PROM as detailed in PT POC.   Consulted and Agree with Plan of Care Patient        Problem List There are no active problems to display for this patient.   Wynelle Fanny, PTA 03/09/2015, 2:07 PM  [facility] [address] [place], [place], [address] Phone: [phone]   Fax:   [phone]

## 2015-03-16 ENCOUNTER — Encounter: Payer: Self-pay | Admitting: Physical Therapy

## 2015-03-16 ENCOUNTER — Ambulatory Visit: Payer: Medicare Other | Attending: Orthopaedic Surgery | Admitting: Physical Therapy

## 2015-03-16 DIAGNOSIS — M25612 Stiffness of left shoulder, not elsewhere classified: Secondary | ICD-10-CM

## 2015-03-16 DIAGNOSIS — M25512 Pain in left shoulder: Secondary | ICD-10-CM | POA: Diagnosis not present

## 2015-03-16 NOTE — Patient Instructions (Signed)
ROM: Flexion - Wand   Bring wand directly over head, leading with right side. Reach back until stretch is felt. Hold ____ seconds. Repeat __10__ times per set. Do _2-3___ sets per session. Do ___2_ sessions per day.  http://orth.exer.us/744   Copyright  VHI. All rights reserved.  ROM: External / Internal Rotation - Wand   Holding wand with left hand palm up, push out from body with other hand, palm down. Keep both elbows bent. When stretch is felt, hold ____ seconds. Repeat to other side, leading with same hand. Keep elbows bent. Repeat _10___ times per set. Do _2-3___ sets per session. Do 2____ sessions per day.  http://orth.exer.us/748   Copyright  VHI. All rights reserved.

## 2015-03-16 NOTE — Therapy (Signed)
[facility] [place], [place], [address] Phone: [phone]   Fax:  [phone]  Physical Therapy Treatment  Patient Details  Name: Rodney Moyer MRN: [id number] Date of Birth: 1946/07/10 Referring Provider:  Garald Balding, MD  Encounter Date: 03/16/2015      PT End of Session - 03/16/15 1137    Visit Number 9   Number of Visits 16   Date for PT Re-Evaluation 03/21/15   PT Start Time 9937   PT Stop Time 1200   PT Time Calculation (min) 26 min   Activity Tolerance Patient tolerated treatment well   Behavior During Therapy [facility] for tasks assessed/performed      No past medical history on file.  No past surgical history on file.  There were no vitals filed for this visit.  Visit Diagnosis:  Left shoulder pain  Shoulder stiffness, left      Subjective Assessment - 03/16/15 1136    Subjective States that shoulder feels "rough" and painful. States he must have slept on shoulder. States he has a pulley system at home.   Limitations Lifting;House hold activities   Currently in Pain? Yes   Pain Score 8    Pain Location Shoulder   Pain Orientation Left   Pain Descriptors / Indicators Stabbing   Pain Type Surgical pain            [facility] PT Assessment - 03/16/15 0001    ROM / Strength   AROM / PROM / Strength AROM   AROM   Overall AROM  Deficits   Overall AROM Comments Scaption 125 deg   AROM Assessment Site Shoulder   Right/Left Shoulder Left   Left Shoulder Flexion 123 Degrees   Left Shoulder Internal Rotation 50 Degrees   Left Shoulder External Rotation 64 Degrees                   [facility] Adult PT Treatment/Exercise - 03/16/15 0001    Exercises   Exercises Shoulder   Shoulder Exercises: Supine   External Rotation AAROM;20 reps   Flexion AAROM;20 reps   Other Supine Exercises Supine AAROM chest press x 20 reps   Shoulder Exercises: Pulleys   Flexion Other (comment)  x 5 minutes                 PT Education - 03/16/15 1200    Education provided Yes   Education Details HEP- AAROM flex, ER with cane   Person(s) Educated Patient   Methods Explanation;Demonstration;Handout   Comprehension Verbalized understanding;Returned demonstration;Verbal cues required          PT Short Term Goals - 03/16/15 1137    PT SHORT TERM GOAL #1   Title be independent with initial HEP   Status Achieved           PT Long Term Goals - 03/16/15 1138    PT LONG TERM GOAL #1   Title demonstrate and/or verbalize techniques to reduce the risk of re-injury to include info on: anti-infammatory (RICE method).   Time 8   Period Weeks   Status Achieved   PT LONG TERM GOAL #2   Title be independent with advanced HEP   Time 8   Period Weeks   Status On-going   PT LONG TERM GOAL #3   Title increase ROM with active left shoulder flexion to 150 degrees so the patient can easily reach overhead   Time 8   Period Weeks   Status  On-going   PT LONG TERM GOAL #4   Title increase ROM with active ER to 75 degrees+ to allow for easily donning/doffing of apparel   Time 8   Period Weeks   Status On-going   PT LONG TERM GOAL #5   Title increase ROM so patient is able to reach behind back to 75 degrees   Time 8   Period Weeks   Status On-going   PT LONG TERM GOAL #6   Title increase L shoulder strength to a solid 4+/5 to increase stability for performance of functional activities   Time 8   Period Weeks   Status On-going   PT LONG TERM GOAL #7   Title perform ADL's with pain not >3/10   Time 8   Period Weeks   Status On-going               Plan - 03/16/15 1201    Clinical Impression Statement Patient tolerated treatment well with some pain during PROM. Patient was late for appointment. AROM measurements are improved. Rated pain after treatment as 7-8/10 in L shoulder.   PT Treatment/Interventions Moist Heat;Patient/family education;Therapeutic exercise;Passive range of  motion;Manual techniques;Cryotherapy;Electrical Stimulation   PT Next Visit Plan Continue with AAROM per PT POC.    Consulted and Agree with Plan of Care Patient        Problem List There are no active problems to display for this patient.   Wynelle Fanny, PTA 03/16/2015, 12:06 PM  [facility] [address] [place], [place], [address] Phone: [phone]   Fax:  [phone]

## 2015-03-22 ENCOUNTER — Encounter: Payer: Medicare Other | Admitting: *Deleted

## 2015-03-23 ENCOUNTER — Ambulatory Visit: Payer: Medicare Other | Admitting: Physical Therapy

## 2015-03-23 ENCOUNTER — Encounter: Payer: Self-pay | Admitting: Physical Therapy

## 2015-03-23 DIAGNOSIS — M25612 Stiffness of left shoulder, not elsewhere classified: Secondary | ICD-10-CM

## 2015-03-23 DIAGNOSIS — M25512 Pain in left shoulder: Secondary | ICD-10-CM

## 2015-03-23 NOTE — Therapy (Signed)
[facility] [place], [place], [address] Phone: [phone]   Fax:  [phone]  Physical Therapy Treatment  Patient Details  Name: Rodney Moyer MRN: [id number] Date of Birth: [birth year] Referring Provider:  Garald Balding, MD  Encounter Date: 03/23/2015      PT End of Session - 03/23/15 1115    Visit Number 10   Number of Visits 16   Date for PT Re-Evaluation 03/21/15   PT Start Time 1113   PT Stop Time 1205   PT Time Calculation (min) 52 min   Activity Tolerance Patient tolerated treatment well   Behavior During Therapy [facility] for tasks assessed/performed      No past medical history on file.  No past surgical history on file.  There were no vitals filed for this visit.  Visit Diagnosis:  Left shoulder pain  Shoulder stiffness, left      Subjective Assessment - 03/23/15 1114    Subjective Stated that pulleys made his pain increase after last treatment,   Limitations Lifting;House hold activities   Currently in Pain? Yes   Pain Score 7    Pain Location Shoulder   Pain Orientation Left   Pain Descriptors / Indicators Stabbing;Aching   Pain Type Surgical pain   Pain Frequency Constant                       [facility] Adult PT Treatment/Exercise - 03/23/15 0001    Shoulder Exercises: Pulleys   Other Pulley Exercises UE ranger flex/circles x 30 reps  Reported increased pain during circles   Modalities   Modalities Cryotherapy;Electrical Stimulation;Ultrasound   Cryotherapy   Number Minutes Cryotherapy 15 Minutes   Cryotherapy Location Shoulder   Type of Cryotherapy Ice pack   Electrical Stimulation   Electrical Stimulation Location Shoulder Left   Electrical Stimulation Action Pre-mod   Electrical Stimulation Parameters 80-150 Hz   Electrical Stimulation Goals Pain   Ultrasound   Ultrasound Location L shoulder   Ultrasound Parameters 1.5 w/cm2, 100%, 10 minutes   Ultrasound Goals Pain                   PT Short Term Goals - 03/16/15 1137    PT SHORT TERM GOAL #1   Title be independent with initial HEP   Status Achieved           PT Long Term Goals - 03/16/15 1138    PT LONG TERM GOAL #1   Title demonstrate and/or verbalize techniques to reduce the risk of re-injury to include info on: anti-infammatory (RICE method).   Time 8   Period Weeks   Status Achieved   PT LONG TERM GOAL #2   Title be independent with advanced HEP   Time 8   Period Weeks   Status On-going   PT LONG TERM GOAL #3   Title increase ROM with active left shoulder flexion to 150 degrees so the patient can easily reach overhead   Time 8   Period Weeks   Status On-going   PT LONG TERM GOAL #4   Title increase ROM with active ER to 75 degrees+ to allow for easily donning/doffing of apparel   Time 8   Period Weeks   Status On-going   PT LONG TERM GOAL #5   Title increase ROM so patient is able to reach behind back to 75 degrees   Time 8   Period Weeks   Status On-going  PT LONG TERM GOAL #6   Title increase L shoulder strength to a solid 4+/5 to increase stability for performance of functional activities   Time 8   Period Weeks   Status On-going   PT LONG TERM GOAL #7   Title perform ADL's with pain not >3/10   Time 8   Period Weeks   Status On-going               Plan - 03/23/15 1138    Clinical Impression Statement With the increased pain during UE ranger AAROM, therapeutic exercise was discontinued during this treatment. MPT was brought in for case conference during treatment due to increased pain. IFC e-stim and cold pack were used initially to control pain followed by ultrasound. Experienced 5/10 pain following treatment.   PT Treatment/Interventions Moist Heat;Patient/family education;Therapeutic exercise;Passive range of motion;Manual techniques;Cryotherapy;Electrical Stimulation   PT Next Visit Plan Continue with PT POC as symptoms dictate.   Consulted  and Agree with Plan of Care Patient        Problem List There are no active problems to display for this patient.   Wynelle Fanny, PTA 03/23/2015, 12:18 PM  [facility] [address] [place], [place], [address] Phone: [phone]   Fax:  [phone]

## 2015-03-29 ENCOUNTER — Encounter: Payer: Self-pay | Admitting: *Deleted

## 2015-03-29 ENCOUNTER — Ambulatory Visit: Payer: Medicare Other | Admitting: *Deleted

## 2015-03-29 DIAGNOSIS — M25612 Stiffness of left shoulder, not elsewhere classified: Secondary | ICD-10-CM

## 2015-03-29 DIAGNOSIS — M25512 Pain in left shoulder: Secondary | ICD-10-CM | POA: Diagnosis not present

## 2015-03-29 NOTE — Therapy (Signed)
[facility] [place], [place], [address] Phone: [phone]   Fax:  [phone]  Physical Therapy Treatment  Patient Details  Name: JEREME LOREN MRN: [id number] Date of Birth: 07/29/1946 Referring Provider:  Garald Balding, MD  Encounter Date: 03/29/2015      PT End of Session - 03/29/15 1340    Visit Number 11   Number of Visits 16   Date for PT Re-Evaluation 03/21/15   PT Start Time 2458   PT Stop Time 1346   PT Time Calculation (min) 43 min      History reviewed. No pertinent past medical history.  History reviewed. No pertinent past surgical history.  There were no vitals filed for this visit.  Visit Diagnosis:  Left shoulder pain  Shoulder stiffness, left      Subjective Assessment - 03/29/15 1324    Subjective Last Rx really helped. NO exs or stretching today   Limitations Lifting;House hold activities   Currently in Pain? Yes   Pain Score 5    Pain Location Shoulder   Pain Orientation Left   Pain Descriptors / Indicators Aching   Pain Type Surgical pain   Pain Frequency Constant   Aggravating Factors  ROM activities                         [facility] Adult PT Treatment/Exercise - 03/29/15 0001    Modalities   Modalities Electrical Stimulation;Moist Heat   Moist Heat Therapy   Number Minutes Moist Heat 15 Minutes   Moist Heat Location Shoulder   Electrical Stimulation   Electrical Stimulation Location Shoulder Left   Electrical Stimulation Action Premod   Electrical Stimulation Parameters 80-150 hz x 15 min hooklying   Ultrasound   Ultrasound Location LT shldr   Ultrasound Parameters 1.5 w/cm2 x10 min   Ultrasound Goals Pain   Manual Therapy   Manual Therapy Myofascial release   Myofascial Release Scar massage IASTM to incisions LT shldr hooklying                  PT Short Term Goals - 03/16/15 1137    PT SHORT TERM GOAL #1   Title be independent with initial  HEP   Status Achieved           PT Long Term Goals - 03/16/15 1138    PT LONG TERM GOAL #1   Title demonstrate and/or verbalize techniques to reduce the risk of re-injury to include info on: anti-infammatory (RICE method).   Time 8   Period Weeks   Status Achieved   PT LONG TERM GOAL #2   Title be independent with advanced HEP   Time 8   Period Weeks   Status On-going   PT LONG TERM GOAL #3   Title increase ROM with active left shoulder flexion to 150 degrees so the patient can easily reach overhead   Time 8   Period Weeks   Status On-going   PT LONG TERM GOAL #4   Title increase ROM with active ER to 75 degrees+ to allow for easily donning/doffing of apparel   Time 8   Period Weeks   Status On-going   PT LONG TERM GOAL #5   Title increase ROM so patient is able to reach behind back to 75 degrees   Time 8   Period Weeks   Status On-going   PT LONG TERM GOAL #6   Title increase L shoulder  strength to a solid 4+/5 to increase stability for performance of functional activities   Time 8   Period Weeks   Status On-going   PT LONG TERM GOAL #7   Title perform ADL's with pain not >3/10   Time 8   Period Weeks   Status On-going               Plan - 03/29/15 1341    Clinical Impression Statement Pt did well with Rx again today and feels he moves his LT arm around enough on his own.    PT Treatment/Interventions Moist Heat;Patient/family education;Therapeutic exercise;Passive range of motion;Manual techniques;Cryotherapy;Electrical Stimulation   PT Next Visit Plan Continue with PT POC as symptoms dictate.   Consulted and Agree with Plan of Care Patient        Problem List There are no active problems to display for this patient.   October Peery,CHRIS, PTA 03/29/2015, 1:45 PM  [facility] [address] [place], [place], [address] Phone: [phone]   Fax:  [phone]

## 2015-04-07 ENCOUNTER — Ambulatory Visit: Payer: Medicare Other | Admitting: *Deleted

## 2015-04-07 ENCOUNTER — Encounter: Payer: Self-pay | Admitting: *Deleted

## 2015-04-07 DIAGNOSIS — M25512 Pain in left shoulder: Secondary | ICD-10-CM | POA: Diagnosis not present

## 2015-04-07 DIAGNOSIS — M25612 Stiffness of left shoulder, not elsewhere classified: Secondary | ICD-10-CM

## 2015-04-07 NOTE — Therapy (Addendum)
[facility] [place], [place], [address] Phone: [phone]   Fax:  [phone]  Physical Therapy Treatment  Patient Details  Name: Rodney Moyer MRN: [id number] Date of Birth: 1946/02/18 Referring Provider:  Garald Balding, MD  Encounter Date: 04/07/2015    Past Medical History:  Diagnosis Date  . Anemia   . Anxiety   . Benign prostate hyperplasia   . Coronary artery disease   . Depression   . Diabetes mellitus without complication    Type II  . Head injury    'years ago'  . Hypertension   . Pulmonary embolism     Past Surgical History:  Procedure Laterality Date  . BACK SURGERY    . CARDIAC CATHETERIZATION  2015  . HERNIA REPAIR    . ROTATOR CUFF REPAIR  1/16    There were no vitals filed for this visit.  Visit Diagnosis:  Left shoulder pain  Shoulder stiffness, left                                 PT Short Term Goals - 03/16/15 1137      PT SHORT TERM GOAL #1   Title be independent with initial HEP   Status Achieved           PT Long Term Goals - 03/16/15 1138      PT LONG TERM GOAL #1   Title demonstrate and/or verbalize techniques to reduce the risk of re-injury to include info on: anti-infammatory (RICE method).   Time 8   Period Weeks   Status Achieved     PT LONG TERM GOAL #2   Title be independent with advanced HEP   Time 8   Period Weeks   Status On-going     PT LONG TERM GOAL #3   Title increase ROM with active left shoulder flexion to 150 degrees so the patient can easily reach overhead   Time 8   Period Weeks   Status On-going     PT LONG TERM GOAL #4   Title increase ROM with active ER to 75 degrees+ to allow for easily donning/doffing of apparel   Time 8   Period Weeks   Status On-going     PT LONG TERM GOAL #5   Title increase ROM so patient is able to reach behind back to 75 degrees   Time 8   Period Weeks   Status On-going     PT LONG TERM GOAL #6   Title increase L shoulder strength to a solid 4+/5 to increase stability for performance of functional activities   Time 8   Period Weeks   Status On-going     PT LONG TERM GOAL #7   Title perform ADL's with pain not >3/10   Time 8   Period Weeks   Status On-going               Problem List There are no active problems to display for this patient.   APPLEGATE, [place], PTA 09/24/2016, 8:00 PM  [facility] [place], [place], [address] Phone: [phone]   Fax:  [phone]  PHYSICAL THERAPY DISCHARGE SUMMARY  Visits from Start of Care: 12.  Current functional level related to goals / functional outcomes: Please see above.   Remaining deficits: Decreased left shoulder ROM and strength and continued pain.   Education / Equipment:  HEP. Plan: Patient agrees to discharge.  Patient goals were not met. Patient is being discharged due to not returning since the last visit.  ?????         Mali Applegate MPT

## 2015-04-12 ENCOUNTER — Encounter: Payer: Medicare Other | Admitting: *Deleted

## 2015-04-14 ENCOUNTER — Encounter (HOSPITAL_BASED_OUTPATIENT_CLINIC_OR_DEPARTMENT_OTHER): Payer: Self-pay | Admitting: *Deleted

## 2015-04-15 NOTE — Progress Notes (Signed)
Attempted to complete pre op phone call.  Pt still unwilling to to cooperate and give information.  Still taking Coumadin, Plavix and Asprin and says that according to his family doctor, because it is a closed manipulation, he can continue all three.  Explained the NPO p MN and he's said 'Ill try.'.  Told what meds to take the morning of surgery, and asked to bring all of his meds with him on Tues.  Arrival time of 11:45.

## 2015-04-15 NOTE — Progress Notes (Signed)
Dr Rudene Anda office notified that pt plans to continue to take his Plavix, Coumadin and Asprin until the day of surgery. Also aware of the pt's unwillingness to review PMH and Meds with RN.

## 2015-04-17 NOTE — H&P (Signed)
Joni Fears, MD   Biagio Borg, PA-C [address] [place], [place]  [address]                             [phone]   ORTHOPAEDIC HISTORY & PHYSICAL  Rodney Moyer MRN:  [id number] DOB/SEX:  01/18/1946/male  CHIEF COMPLAINT:  Painful left shoulder  HISTORY:Mr. Sawaya is status post left shoulder surgery on March 28.  He had a diagnostic arthroscopy with debridement of labral fraying and release of a biceps tendon synovectomy.  He had a arthroscopic SAD and open biceps tenodesis.  He had a prior distal clavicle resection.  He has been going to physical therapy, but notes he is still having some pain and thus returns to the office.  I have not seen him since March 8.   PAST MEDICAL HISTORY: There are no active problems to display for this patient.  Past Medical History  Diagnosis Date  . Coronary artery disease   . Anxiety   . Depression   . Anemia   . Hypertension   . Diabetes mellitus without complication     Type II  . Head injury     'years ago'  . Pulmonary embolism   . Benign prostate hyperplasia    Past Surgical History  Procedure Laterality Date  . Back surgery    . Hernia repair    . Cardiac catheterization  2015  . Rotator cuff repair  1/16     MEDICATIONS:   Prescriptions prior to admission  Medication Sig Dispense Refill Last Dose  . aspirin 81 MG tablet Take 81 mg by mouth daily.   04/18/2015 at Unknown time  . canagliflozin (INVOKANA) 300 MG TABS tablet Take 300 mg by mouth daily before breakfast.   04/18/2015 at Unknown time  . cholecalciferol (VITAMIN D) 1000 UNITS tablet Take 1,000 Units by mouth daily.   04/18/2015 at Unknown time  . clonazePAM (KLONOPIN) 0.5 MG tablet Take 0.5 mg by mouth 2 (two) times daily as needed for anxiety.   04/19/2015 at 0800  . clopidogrel (PLAVIX) 75 MG tablet Take 75 mg by mouth daily.   Past Week at Unknown time  . cyanocobalamin 1000 MCG tablet Take 100 mcg by mouth daily.   04/18/2015 at Unknown time  .  divalproex (DEPAKOTE) 500 MG DR tablet Take 1,500 mg by mouth at bedtime.   04/18/2015 at Unknown time  . escitalopram (LEXAPRO) 20 MG tablet Take 20 mg by mouth daily.   04/18/2015 at Unknown time  . finasteride (PROSCAR) 5 MG tablet Take 5 mg by mouth daily.   04/18/2015 at Unknown time  . glipiZIDE (GLUCOTROL) 5 MG tablet Take 5 mg by mouth daily before breakfast.   04/18/2015 at Unknown time  . HYDROcodone-acetaminophen (NORCO/VICODIN) 5-325 MG per tablet Take 1 tablet by mouth every 6 (six) hours as needed for moderate pain.   04/18/2015 at Unknown time  . insulin lispro protamine-lispro (HUMALOG 75/25 MIX) (75-25) 100 UNIT/ML SUSP injection Inject 25 Units into the skin daily with breakfast.   04/18/2015 at Unknown time  . insulin lispro protamine-lispro (HUMALOG 75/25 MIX) (75-25) 100 UNIT/ML SUSP injection Inject 15 Units into the skin daily with supper.   04/18/2015 at Unknown time  . lisinopril (PRINIVIL,ZESTRIL) 5 MG tablet Take 5 mg by mouth daily. Take half a tab   04/18/2015 at Unknown time  . metoprolol succinate (TOPROL-XL) 25 MG 24 hr tablet  Take 25 mg by mouth daily.   04/18/2015 at Unknown time  . niacin (SLO-NIACIN) 500 MG tablet Take 500 mg by mouth 2 (two) times daily at 10 AM and 5 PM.   04/18/2015 at Unknown time  . omeprazole (PRILOSEC) 20 MG capsule Take 20 mg by mouth daily.   04/18/2015 at Unknown time  . simvastatin (ZOCOR) 40 MG tablet Take 40 mg by mouth daily.   04/18/2015 at Unknown time  . tamsulosin (FLOMAX) 0.4 MG CAPS capsule Take 0.4 mg by mouth daily.   04/18/2015 at Unknown time  . warfarin (COUMADIN) 5 MG tablet Take 5 mg by mouth daily.   Past Week at Unknown time    ALLERGIES:   Allergies  Allergen Reactions  . Bee Venom Swelling    Has epi pen available  . Cortisone Other (See Comments)    "Hiccups and belching - have to be put to sleep to get rid of"  . Onion Other (See Comments)    Nausea, vomiting, diarrhea    REVIEW OF SYSTEMS:  14 point ROS negative except  for Gastrointestinal: negative except for colitis   FAMILY HISTORY:  History reviewed. No pertinent family history.  SOCIAL HISTORY:   History  Substance Use Topics  . Smoking status: Current Every Day Smoker    Types: Cigarettes  . Smokeless tobacco: Not on file  . Alcohol Use: No      EXAMINATION: Vital signs in last 24 hours: Temp:  [98.1 F (36.7 C)] 98.1 F (36.7 C) (05/10 1126) Pulse Rate:  [81] 81 (05/10 1126) Resp:  [20] 20 (05/10 1126) BP: (123)/(77) 123/77 mmHg (05/10 1126) SpO2:  [95 %] 95 % (05/10 1126) Weight:  [95.709 kg (211 lb)] 95.709 kg (211 lb) (05/10 1126)  Head is normocephalic.   Eyes:  Pupils equal, round and reactive to light and accommodation.  Extraocular intact. ENT: Ears, nose, and throat were benign.   Neck: supple, no bruits were noted.   Chest: good expansion.   Lungs: essentially clear.   Cardiac: regular rhythm and rate, normal S1, S2.  No murmurs appreciated. Pulses :  2+ bilateral and symmetric in upper extremities. Abdomen is scaphoid, soft, nontender, no masses palpable, normal bowel sounds                  present. CNS:  He is oriented x3 and cranial nerves II-XII grossly intact. Breast, rectal, and genital exams: not performed and not indicated for an orthopedic evaluation. Musculoskeletal:  He has evidence of adhesive capsulitis.  He lacked about the last 30-35 degrees of overhead motion and about 9 degrees of abduction, but beyond that he was uncomfortable.  He had negative impingement.  The biceps appears to be okay.  Neurovascular exam is intact.  I thought he had good strength with internal and external rotation.       ASSESSMENT: left shoulder arthrofibrosis  Past Medical History  Diagnosis Date  . Coronary artery disease   . Anxiety   . Depression   . Anemia   . Hypertension   . Diabetes mellitus without complication     Type II  . Head injury     'years ago'  . Pulmonary embolism   . Benign prostate hyperplasia      PLAN: Plan for left shoulder manipulation  The procedure,  risks, and benefits of surgery were presented and reviewed. The risks including but not limited to infection, blood clots, vascular and nerve injury, stiffness,  among others  were discussed. The patient acknowledged the explanation, agreed to proceed.   Mike Craze [place], [place] [phone]  04/19/2015 1:08 PM

## 2015-04-19 ENCOUNTER — Encounter (HOSPITAL_BASED_OUTPATIENT_CLINIC_OR_DEPARTMENT_OTHER): Payer: Self-pay | Admitting: Certified Registered"

## 2015-04-19 ENCOUNTER — Encounter (HOSPITAL_BASED_OUTPATIENT_CLINIC_OR_DEPARTMENT_OTHER)
Admission: RE | Disposition: A | Discharge status: Home / Self Care | Payer: Self-pay | Source: Ambulatory Visit | Attending: Orthopaedic Surgery

## 2015-04-19 ENCOUNTER — Ambulatory Visit (HOSPITAL_BASED_OUTPATIENT_CLINIC_OR_DEPARTMENT_OTHER): Payer: Medicare Other | Admitting: Certified Registered"

## 2015-04-19 ENCOUNTER — Ambulatory Visit (HOSPITAL_BASED_OUTPATIENT_CLINIC_OR_DEPARTMENT_OTHER)
Admission: RE | Admit: 2015-04-19 | Discharge: 2015-04-19 | Disposition: A | Discharge status: Home / Self Care | Payer: Medicare Other | Source: Ambulatory Visit | Attending: Orthopaedic Surgery | Admitting: Orthopaedic Surgery

## 2015-04-19 DIAGNOSIS — Z5329 Procedure and treatment not carried out because of patient's decision for other reasons: Secondary | ICD-10-CM | POA: Insufficient documentation

## 2015-04-19 DIAGNOSIS — F329 Major depressive disorder, single episode, unspecified: Secondary | ICD-10-CM | POA: Insufficient documentation

## 2015-04-19 DIAGNOSIS — Z86711 Personal history of pulmonary embolism: Secondary | ICD-10-CM | POA: Insufficient documentation

## 2015-04-19 DIAGNOSIS — Z79891 Long term (current) use of opiate analgesic: Secondary | ICD-10-CM | POA: Insufficient documentation

## 2015-04-19 DIAGNOSIS — E119 Type 2 diabetes mellitus without complications: Secondary | ICD-10-CM | POA: Insufficient documentation

## 2015-04-19 DIAGNOSIS — I10 Essential (primary) hypertension: Secondary | ICD-10-CM | POA: Insufficient documentation

## 2015-04-19 DIAGNOSIS — Z7902 Long term (current) use of antithrombotics/antiplatelets: Secondary | ICD-10-CM | POA: Insufficient documentation

## 2015-04-19 DIAGNOSIS — Z7982 Long term (current) use of aspirin: Secondary | ICD-10-CM | POA: Insufficient documentation

## 2015-04-19 DIAGNOSIS — Z79899 Other long term (current) drug therapy: Secondary | ICD-10-CM | POA: Insufficient documentation

## 2015-04-19 DIAGNOSIS — M25512 Pain in left shoulder: Secondary | ICD-10-CM | POA: Insufficient documentation

## 2015-04-19 DIAGNOSIS — Z7901 Long term (current) use of anticoagulants: Secondary | ICD-10-CM | POA: Insufficient documentation

## 2015-04-19 DIAGNOSIS — N400 Enlarged prostate without lower urinary tract symptoms: Secondary | ICD-10-CM | POA: Insufficient documentation

## 2015-04-19 DIAGNOSIS — Z794 Long term (current) use of insulin: Secondary | ICD-10-CM | POA: Insufficient documentation

## 2015-04-19 DIAGNOSIS — I2510 Atherosclerotic heart disease of native coronary artery without angina pectoris: Secondary | ICD-10-CM | POA: Insufficient documentation

## 2015-04-19 DIAGNOSIS — F17210 Nicotine dependence, cigarettes, uncomplicated: Secondary | ICD-10-CM | POA: Insufficient documentation

## 2015-04-19 DIAGNOSIS — F419 Anxiety disorder, unspecified: Secondary | ICD-10-CM | POA: Insufficient documentation

## 2015-04-19 HISTORY — DX: Anemia, unspecified: D64.9

## 2015-04-19 HISTORY — DX: Essential (primary) hypertension: I10

## 2015-04-19 HISTORY — DX: Atherosclerotic heart disease of native coronary artery without angina pectoris: I25.10

## 2015-04-19 HISTORY — DX: Anxiety disorder, unspecified: F41.9

## 2015-04-19 HISTORY — DX: Type 2 diabetes mellitus without complications: E11.9

## 2015-04-19 HISTORY — DX: Unspecified injury of head, initial encounter: S09.90XA

## 2015-04-19 HISTORY — DX: Depression, unspecified: F32.A

## 2015-04-19 HISTORY — DX: Major depressive disorder, single episode, unspecified: F32.9

## 2015-04-19 HISTORY — DX: Benign prostatic hyperplasia without lower urinary tract symptoms: N40.0

## 2015-04-19 HISTORY — DX: Other pulmonary embolism without acute cor pulmonale: I26.99

## 2015-04-19 LAB — GLUCOSE, CAPILLARY: Glucose-Capillary: 208 mg/dL — ABNORMAL HIGH (ref 70–99)

## 2015-04-19 SURGERY — CANCELLED PROCEDURE

## 2015-04-19 MED ORDER — PROPOFOL 500 MG/50ML IV EMUL
INTRAVENOUS | Status: AC
  Filled 2015-04-19: qty 50

## 2015-04-19 MED ORDER — CHLORHEXIDINE GLUCONATE 4 % EX LIQD: 60.0000 mL | Freq: Once | CUTANEOUS | Status: DC

## 2015-04-19 MED ORDER — MIDAZOLAM HCL 2 MG/2ML IJ SOLN
INTRAMUSCULAR | Status: AC
  Filled 2015-04-19: qty 2

## 2015-04-19 MED ORDER — LACTATED RINGERS IV SOLN
INTRAVENOUS | Status: DC
  Administered 2015-04-19: 12:00:00 via INTRAVENOUS

## 2015-04-19 MED ORDER — FENTANYL CITRATE (PF) 100 MCG/2ML IJ SOLN: 50.0000 ug | INTRAMUSCULAR | Status: DC | PRN

## 2015-04-19 MED ORDER — FENTANYL CITRATE (PF) 100 MCG/2ML IJ SOLN
INTRAMUSCULAR | Status: AC
  Filled 2015-04-19: qty 4

## 2015-04-19 MED ORDER — SODIUM CHLORIDE 0.9 % IV SOLN: 75.0000 mL/h | INTRAVENOUS | Status: DC

## 2015-04-19 MED ORDER — MIDAZOLAM HCL 2 MG/2ML IJ SOLN
1.0000 mg | INTRAMUSCULAR | Status: DC | PRN
Start: 2015-04-19 — End: 2015-04-19

## 2015-04-19 SURGICAL SUPPLY — 13 items
BANDAGE ADH SHEER 1  50/CT (GAUZE/BANDAGES/DRESSINGS) ×4 IMPLANT
GLOVE BIOGEL PI IND STRL 8 (GLOVE) ×2 IMPLANT
GLOVE BIOGEL PI INDICATOR 8 (GLOVE) ×2
GLOVE ECLIPSE 8.0 STRL XLNG CF (GLOVE) ×4 IMPLANT
NDL SAFETY ECLIPSE 18X1.5 (NEEDLE) IMPLANT
NDL SPNL 18GX3.5 QUINCKE PK (NEEDLE) IMPLANT
NEEDLE HYPO 18GX1.5 SHARP (NEEDLE)
NEEDLE HYPO 22GX1.5 SAFETY (NEEDLE) IMPLANT
NEEDLE SPNL 18GX3.5 QUINCKE PK (NEEDLE) IMPLANT
PAD ALCOHOL SWAB (MISCELLANEOUS) IMPLANT
SPONGE GAUZE 4X4 12PLY STER LF (GAUZE/BANDAGES/DRESSINGS) IMPLANT
SWABSTICK POVIDONE IODINE SNGL (MISCELLANEOUS) IMPLANT
SYR CONTROL 10ML LL (SYRINGE) IMPLANT

## 2015-04-19 NOTE — Anesthesia Preprocedure Evaluation (Signed)
Anesthesia Evaluation  Patient identified by MRN, date of birth, ID band Patient awake    Reviewed: Allergy & Precautions, NPO status , Patient's Chart, lab work & pertinent test results  Airway Mallampati: I  TM Distance: >3 FB Neck ROM: Full    Dental   Pulmonary Current Smoker,    Pulmonary exam normal       Cardiovascular hypertension, Pt. on medications Normal cardiovascular exam    Neuro/Psych Anxiety Depression    GI/Hepatic   Endo/Other  diabetes, Type 2, Oral Hypoglycemic Agents  Renal/GU      Musculoskeletal   Abdominal   Peds  Hematology   Anesthesia Other Findings   Reproductive/Obstetrics                             Anesthesia Physical Anesthesia Plan  ASA: III  Anesthesia Plan: General   Post-op Pain Management:    Induction: Intravenous  Airway Management Planned: LMA  Additional Equipment:   Intra-op Plan:   Post-operative Plan: Extubation in OR  Informed Consent: I have reviewed the patients History and Physical, chart, labs and discussed the procedure including the risks, benefits and alternatives for the proposed anesthesia with the patient or authorized representative who has indicated his/her understanding and acceptance.     Plan Discussed with: CRNA and Surgeon  Anesthesia Plan Comments:         Anesthesia Quick Evaluation

## 2015-04-19 NOTE — Progress Notes (Signed)
Pt has exhibited angry and frustrated behavior since admission, giving short answers to nursing staff and showing lack of patience with pre-surgical procedures and respect to staff. When pt became angry that surgery hasn't started, called asst director Sherron Monday, RN) to speak with pt. Pt became belligerent, screaming at her. Case cancelled per asst directory [facility]), security paged, PIV removed, clothing returned and pt changed clothes and left, screaming on the way out the door. Daughter present for confrontation and appears frustrated with father's behavior.

## 2015-12-11 HISTORY — PX: NECK SURGERY: SHX720

## 2016-09-03 ENCOUNTER — Other Ambulatory Visit (HOSPITAL_COMMUNITY): Payer: Self-pay | Admitting: Chiropractic Medicine

## 2016-09-03 DIAGNOSIS — M79603 Pain in arm, unspecified: Secondary | ICD-10-CM

## 2016-09-03 DIAGNOSIS — M542 Cervicalgia: Secondary | ICD-10-CM

## 2016-09-07 ENCOUNTER — Ambulatory Visit (HOSPITAL_COMMUNITY)
Admission: RE | Admit: 2016-09-07 | Discharge: 2016-09-07 | Disposition: A | Discharge status: Home / Self Care | Payer: Medicare Other | Source: Ambulatory Visit | Attending: Chiropractic Medicine | Admitting: Chiropractic Medicine

## 2016-09-07 DIAGNOSIS — M79603 Pain in arm, unspecified: Secondary | ICD-10-CM | POA: Insufficient documentation

## 2016-09-07 DIAGNOSIS — M4802 Spinal stenosis, cervical region: Secondary | ICD-10-CM | POA: Insufficient documentation

## 2016-09-07 DIAGNOSIS — M542 Cervicalgia: Secondary | ICD-10-CM | POA: Diagnosis present

## 2017-06-13 HISTORY — PX: CATARACT EXTRACTION: SUR2

## 2017-07-06 ENCOUNTER — Observation Stay (HOSPITAL_COMMUNITY)
Admission: EM | Admit: 2017-07-06 | Discharge: 2017-07-09 | Disposition: A | Discharge status: Home / Self Care | Payer: Medicare Other | Attending: Internal Medicine | Admitting: Internal Medicine

## 2017-07-06 ENCOUNTER — Encounter (HOSPITAL_COMMUNITY): Payer: Self-pay | Admitting: Emergency Medicine

## 2017-07-06 ENCOUNTER — Emergency Department (HOSPITAL_COMMUNITY): Payer: Medicare Other

## 2017-07-06 DIAGNOSIS — D6832 Hemorrhagic disorder due to extrinsic circulating anticoagulants: Secondary | ICD-10-CM | POA: Diagnosis present

## 2017-07-06 DIAGNOSIS — R1030 Lower abdominal pain, unspecified: Secondary | ICD-10-CM | POA: Diagnosis present

## 2017-07-06 DIAGNOSIS — Z7984 Long term (current) use of oral hypoglycemic drugs: Secondary | ICD-10-CM | POA: Diagnosis not present

## 2017-07-06 DIAGNOSIS — T45515A Adverse effect of anticoagulants, initial encounter: Secondary | ICD-10-CM | POA: Diagnosis not present

## 2017-07-06 DIAGNOSIS — Z7982 Long term (current) use of aspirin: Secondary | ICD-10-CM | POA: Insufficient documentation

## 2017-07-06 DIAGNOSIS — I2510 Atherosclerotic heart disease of native coronary artery without angina pectoris: Secondary | ICD-10-CM | POA: Diagnosis not present

## 2017-07-06 DIAGNOSIS — N289 Disorder of kidney and ureter, unspecified: Secondary | ICD-10-CM | POA: Diagnosis not present

## 2017-07-06 DIAGNOSIS — I10 Essential (primary) hypertension: Secondary | ICD-10-CM | POA: Diagnosis not present

## 2017-07-06 DIAGNOSIS — I714 Abdominal aortic aneurysm, without rupture, unspecified: Secondary | ICD-10-CM | POA: Diagnosis present

## 2017-07-06 DIAGNOSIS — F17210 Nicotine dependence, cigarettes, uncomplicated: Secondary | ICD-10-CM | POA: Diagnosis not present

## 2017-07-06 DIAGNOSIS — K661 Hemoperitoneum: Secondary | ICD-10-CM | POA: Diagnosis not present

## 2017-07-06 DIAGNOSIS — R791 Abnormal coagulation profile: Secondary | ICD-10-CM | POA: Insufficient documentation

## 2017-07-06 DIAGNOSIS — Z7901 Long term (current) use of anticoagulants: Secondary | ICD-10-CM | POA: Insufficient documentation

## 2017-07-06 DIAGNOSIS — E119 Type 2 diabetes mellitus without complications: Secondary | ICD-10-CM | POA: Insufficient documentation

## 2017-07-06 DIAGNOSIS — Z86711 Personal history of pulmonary embolism: Secondary | ICD-10-CM | POA: Insufficient documentation

## 2017-07-06 DIAGNOSIS — R1032 Left lower quadrant pain: Secondary | ICD-10-CM | POA: Diagnosis present

## 2017-07-06 DIAGNOSIS — K683 Retroperitoneal hematoma: Secondary | ICD-10-CM | POA: Diagnosis present

## 2017-07-06 HISTORY — DX: Benign neoplasm of brain, unspecified: D33.2

## 2017-07-06 LAB — PROTIME-INR
INR: 9.83 — AB
Prothrombin Time: 82.2 seconds — ABNORMAL HIGH (ref 11.4–15.2)

## 2017-07-06 LAB — CBC WITH DIFFERENTIAL/PLATELET
Basophils Absolute: 0 10*3/uL (ref 0.0–0.1)
Basophils Relative: 0 %
Eosinophils Absolute: 0.1 10*3/uL (ref 0.0–0.7)
Eosinophils Relative: 1 %
HEMATOCRIT: 37 % — AB (ref 39.0–52.0)
HEMOGLOBIN: 12.5 g/dL — AB (ref 13.0–17.0)
Lymphocytes Relative: 25 %
Lymphs Abs: 2 10*3/uL (ref 0.7–4.0)
MCH: 30.6 pg (ref 26.0–34.0)
MCHC: 33.8 g/dL (ref 30.0–36.0)
MCV: 90.7 fL (ref 78.0–100.0)
Monocytes Absolute: 0.8 10*3/uL (ref 0.1–1.0)
Monocytes Relative: 10 %
Neutro Abs: 5.1 10*3/uL (ref 1.7–7.7)
Neutrophils Relative %: 64 %
Platelets: 152 10*3/uL (ref 150–400)
RBC: 4.08 MIL/uL — ABNORMAL LOW (ref 4.22–5.81)
RDW: 14.2 % (ref 11.5–15.5)
WBC: 7.9 10*3/uL (ref 4.0–10.5)

## 2017-07-06 LAB — CBC
HEMATOCRIT: 35.8 % — AB (ref 39.0–52.0)
HEMOGLOBIN: 12.2 g/dL — AB (ref 13.0–17.0)
MCH: 31.2 pg (ref 26.0–34.0)
MCHC: 34.1 g/dL (ref 30.0–36.0)
MCV: 91.6 fL (ref 78.0–100.0)
PLATELETS: 146 10*3/uL — AB (ref 150–400)
RBC: 3.91 MIL/uL — AB (ref 4.22–5.81)
RDW: 14.2 % (ref 11.5–15.5)
WBC: 7 10*3/uL (ref 4.0–10.5)

## 2017-07-06 LAB — BASIC METABOLIC PANEL
Anion gap: 9 (ref 5–15)
BUN: 16 mg/dL (ref 6–20)
CO2: 25 mmol/L (ref 22–32)
CREATININE: 1.5 mg/dL — AB (ref 0.61–1.24)
Calcium: 8.9 mg/dL (ref 8.9–10.3)
Chloride: 104 mmol/L (ref 101–111)
GFR calc Af Amer: 52 mL/min — ABNORMAL LOW (ref >60)
GFR calc non Af Amer: 45 mL/min — ABNORMAL LOW (ref >60)
Glucose, Bld: 150 mg/dL — ABNORMAL HIGH (ref 65–99)
POTASSIUM: 3.4 mmol/L — AB (ref 3.5–5.1)
Sodium: 138 mmol/L (ref 135–145)

## 2017-07-06 LAB — URINALYSIS, ROUTINE W REFLEX MICROSCOPIC
BACTERIA UA: NONE SEEN
Bilirubin Urine: NEGATIVE
Glucose, UA: >=500 mg/dL — AB
HGB URINE DIPSTICK: NEGATIVE
Ketones, ur: NEGATIVE mg/dL
Leukocytes, UA: NEGATIVE
Nitrite: NEGATIVE
PH: 6 (ref 5.0–8.0)
Protein, ur: NEGATIVE mg/dL
RBC / HPF: NONE SEEN RBC/hpf (ref 0–5)
SPECIFIC GRAVITY, URINE: 1.026 (ref 1.005–1.030)

## 2017-07-06 LAB — TYPE AND SCREEN
ABO/RH(D): A POS
Antibody Screen: NEGATIVE

## 2017-07-06 LAB — GLUCOSE, CAPILLARY
GLUCOSE-CAPILLARY: 241 mg/dL — AB (ref 65–99)
GLUCOSE-CAPILLARY: 191 mg/dL — AB (ref 65–99)

## 2017-07-06 MED ORDER — DOCUSATE SODIUM 100 MG PO CAPS
100.0000 mg | ORAL_CAPSULE | Freq: Two times a day (BID) | ORAL | Status: DC
  Administered 2017-07-06 – 2017-07-09 (×4): 100 mg via ORAL
  Filled 2017-07-06 (×6): qty 1

## 2017-07-06 MED ORDER — INSULIN DETEMIR 100 UNIT/ML ~~LOC~~ SOLN
10.0000 [IU] | Freq: Two times a day (BID) | SUBCUTANEOUS | Status: DC
  Administered 2017-07-07 – 2017-07-09 (×5): 10 [IU] via SUBCUTANEOUS
  Filled 2017-07-06 (×8): qty 0.1

## 2017-07-06 MED ORDER — INSULIN ASPART 100 UNIT/ML ~~LOC~~ SOLN
0.0000 [IU] | Freq: Three times a day (TID) | SUBCUTANEOUS | Status: DC
  Administered 2017-07-06: 5 [IU] via SUBCUTANEOUS
  Administered 2017-07-07 – 2017-07-08 (×3): 3 [IU] via SUBCUTANEOUS
  Administered 2017-07-08: 5 [IU] via SUBCUTANEOUS
  Administered 2017-07-08: 2 [IU] via SUBCUTANEOUS
  Administered 2017-07-09: 3 [IU] via SUBCUTANEOUS
  Administered 2017-07-09: 2 [IU] via SUBCUTANEOUS

## 2017-07-06 MED ORDER — ALBUTEROL SULFATE (2.5 MG/3ML) 0.083% IN NEBU: 2.5000 mg | INHALATION_SOLUTION | RESPIRATORY_TRACT | Status: DC | PRN

## 2017-07-06 MED ORDER — OXYCODONE-ACETAMINOPHEN 5-325 MG PO TABS
2.0000 | ORAL_TABLET | Freq: Once | ORAL | Status: AC
  Administered 2017-07-06: 2 via ORAL
  Filled 2017-07-06: qty 2

## 2017-07-06 MED ORDER — METOPROLOL SUCCINATE ER 25 MG PO TB24
25.0000 mg | ORAL_TABLET | Freq: Every day | ORAL | Status: DC
  Administered 2017-07-07 – 2017-07-09 (×3): 25 mg via ORAL
  Filled 2017-07-06 (×3): qty 1

## 2017-07-06 MED ORDER — VITAMIN D (ERGOCALCIFEROL) 1.25 MG (50000 UNIT) PO CAPS
50000.0000 [IU] | ORAL_CAPSULE | ORAL | Status: DC
  Administered 2017-07-07: 50000 [IU] via ORAL
  Filled 2017-07-06 (×2): qty 1

## 2017-07-06 MED ORDER — ONDANSETRON HCL 4 MG PO TABS
4.0000 mg | ORAL_TABLET | Freq: Four times a day (QID) | ORAL | Status: DC | PRN
  Administered 2017-07-07: 4 mg via ORAL
  Filled 2017-07-06: qty 1

## 2017-07-06 MED ORDER — ACETAMINOPHEN 325 MG PO TABS
650.0000 mg | ORAL_TABLET | Freq: Four times a day (QID) | ORAL | Status: DC | PRN
  Administered 2017-07-08: 650 mg via ORAL
  Filled 2017-07-06: qty 2

## 2017-07-06 MED ORDER — FINASTERIDE 5 MG PO TABS
5.0000 mg | ORAL_TABLET | Freq: Every day | ORAL | Status: DC
  Administered 2017-07-06 – 2017-07-09 (×4): 5 mg via ORAL
  Filled 2017-07-06 (×6): qty 1

## 2017-07-06 MED ORDER — POTASSIUM CHLORIDE CRYS ER 20 MEQ PO TBCR
20.0000 meq | EXTENDED_RELEASE_TABLET | Freq: Two times a day (BID) | ORAL | Status: AC
  Administered 2017-07-06 – 2017-07-07 (×4): 20 meq via ORAL
  Filled 2017-07-06 (×4): qty 1

## 2017-07-06 MED ORDER — NICOTINE 14 MG/24HR TD PT24
14.0000 mg | MEDICATED_PATCH | Freq: Every day | TRANSDERMAL | Status: DC
  Administered 2017-07-06 – 2017-07-09 (×4): 14 mg via TRANSDERMAL
  Filled 2017-07-06 (×4): qty 1

## 2017-07-06 MED ORDER — VITAMIN B-12 100 MCG PO TABS
100.0000 ug | ORAL_TABLET | Freq: Every day | ORAL | Status: DC
Start: 2017-07-06 — End: 2017-07-09
  Administered 2017-07-06 – 2017-07-09 (×4): 100 ug via ORAL
  Filled 2017-07-06 (×4): qty 1

## 2017-07-06 MED ORDER — ACETAMINOPHEN 650 MG RE SUPP: 650.0000 mg | Freq: Four times a day (QID) | RECTAL | Status: DC | PRN

## 2017-07-06 MED ORDER — POTASSIUM CHLORIDE IN NACL 20-0.9 MEQ/L-% IV SOLN
INTRAVENOUS | Status: DC
  Administered 2017-07-06 – 2017-07-07 (×2): via INTRAVENOUS

## 2017-07-06 MED ORDER — NIACIN ER 500 MG PO TBCR
500.0000 mg | EXTENDED_RELEASE_TABLET | Freq: Two times a day (BID) | ORAL | Status: DC
  Filled 2017-07-06 (×3): qty 1

## 2017-07-06 MED ORDER — ESCITALOPRAM OXALATE 10 MG PO TABS
20.0000 mg | ORAL_TABLET | Freq: Every day | ORAL | Status: DC
  Administered 2017-07-06 – 2017-07-09 (×4): 20 mg via ORAL
  Filled 2017-07-06: qty 2
  Filled 2017-07-06 (×2): qty 1
  Filled 2017-07-06 (×2): qty 2
  Filled 2017-07-06: qty 1
  Filled 2017-07-06: qty 2

## 2017-07-06 MED ORDER — TAMSULOSIN HCL 0.4 MG PO CAPS
0.4000 mg | ORAL_CAPSULE | Freq: Every day | ORAL | Status: DC
  Administered 2017-07-06 – 2017-07-09 (×4): 0.4 mg via ORAL
  Filled 2017-07-06 (×4): qty 1

## 2017-07-06 MED ORDER — OXYCODONE-ACETAMINOPHEN 5-325 MG PO TABS: 1.0000 | ORAL_TABLET | Freq: Once | ORAL | Status: DC

## 2017-07-06 MED ORDER — DIVALPROEX SODIUM 250 MG PO DR TAB
1500.0000 mg | DELAYED_RELEASE_TABLET | Freq: Every day | ORAL | Status: DC
  Administered 2017-07-06 – 2017-07-08 (×3): 1500 mg via ORAL
  Filled 2017-07-06 (×3): qty 6

## 2017-07-06 MED ORDER — ONDANSETRON HCL 4 MG/2ML IJ SOLN: 4.0000 mg | Freq: Four times a day (QID) | INTRAMUSCULAR | Status: DC | PRN

## 2017-07-06 MED ORDER — HYDROCODONE-ACETAMINOPHEN 5-325 MG PO TABS
1.0000 | ORAL_TABLET | Freq: Four times a day (QID) | ORAL | Status: DC | PRN
  Administered 2017-07-06 – 2017-07-08 (×4): 1 via ORAL
  Filled 2017-07-06 (×4): qty 1

## 2017-07-06 MED ORDER — CLONAZEPAM 0.5 MG PO TABS
0.5000 mg | ORAL_TABLET | Freq: Two times a day (BID) | ORAL | Status: DC | PRN
  Administered 2017-07-07 – 2017-07-08 (×2): 0.5 mg via ORAL
  Filled 2017-07-06 (×2): qty 1

## 2017-07-06 MED ORDER — VITAMIN K1 10 MG/ML IJ SOLN
10.0000 mg | Freq: Once | INTRAVENOUS | Status: AC
  Administered 2017-07-06: 10 mg via INTRAVENOUS
  Filled 2017-07-06: qty 1

## 2017-07-06 MED ORDER — INSULIN ASPART 100 UNIT/ML ~~LOC~~ SOLN: 0.0000 [IU] | Freq: Every day | SUBCUTANEOUS | Status: DC

## 2017-07-06 MED ORDER — LISINOPRIL 5 MG PO TABS
5.0000 mg | ORAL_TABLET | Freq: Every day | ORAL | Status: DC
  Administered 2017-07-07 – 2017-07-09 (×3): 5 mg via ORAL
  Filled 2017-07-06 (×3): qty 1

## 2017-07-06 MED ORDER — SIMVASTATIN 10 MG PO TABS
40.0000 mg | ORAL_TABLET | Freq: Every day | ORAL | Status: DC
  Administered 2017-07-06 – 2017-07-08 (×3): 40 mg via ORAL
  Filled 2017-07-06 (×3): qty 4

## 2017-07-06 MED ORDER — PANTOPRAZOLE SODIUM 40 MG PO TBEC
40.0000 mg | DELAYED_RELEASE_TABLET | Freq: Every day | ORAL | Status: DC
  Administered 2017-07-06 – 2017-07-09 (×4): 40 mg via ORAL
  Filled 2017-07-06 (×4): qty 1

## 2017-07-06 NOTE — Progress Notes (Signed)
Patient using home BIPAP machine. RT inspected machine before putting into use. Patient's machine is visible dirty and smells of cigarettes. RT cleaned machine best she could to get some of the dirt out of the mask and water chamber. After cleaning unit, wires were inspected and machine tested to see if it worked appropriately. Machine is working fine and patient stated he would put on independently when he got ready to go to sleep.

## 2017-07-06 NOTE — ED Provider Notes (Signed)
[facility] DEPT Provider Note   CSN: [id number] Arrival date & time: 07/06/17  8841     History   Chief Complaint Chief Complaint  Patient presents with  . Groin Pain    HPI Rodney Moyer is a 71 y.o. male.  HPI  Pt was seen at 1005. Per pt, c/o gradual onset and persistence of constant left groin "pain" for the past 4 days. Pt states he was laying in his recliner then stood up, and his symptoms began. Pt was evaluated at a local [facility], dx arthritis, and rx prednisone. Pt states his pain continues. Pain worsens with palpation of the area and moving his left leg. Denies injury, no focal motor weakness, no tingling/numbness in extremities, no rash, no back pain, no abd pain.    Past Medical History:  Diagnosis Date  . Anemia   . Anxiety   . Benign prostate hyperplasia   . Coronary artery disease   . Depression   . Diabetes mellitus without complication ([place])    Type II  . Head injury    'years ago'  . Hypertension   . Pulmonary embolism ([facility])     There are no active problems to display for this patient.   Past Surgical History:  Procedure Laterality Date  . BACK SURGERY    . CARDIAC CATHETERIZATION  2015  . HERNIA REPAIR    . ROTATOR CUFF REPAIR  1/16       Home Medications    Prior to Admission medications   Medication Sig Start Date End Date Taking? Authorizing Provider  aspirin 81 MG tablet Take 81 mg by mouth daily.   Yes [provider]  clonazePAM (KLONOPIN) 0.5 MG tablet Take 0.5 mg by mouth 2 (two) times daily as needed for anxiety.   Yes [provider]  cyanocobalamin 1000 MCG tablet Take 100 mcg by mouth daily.   Yes [provider]  divalproex (DEPAKOTE) 500 MG DR tablet Take 1,500 mg by mouth at bedtime.   Yes [provider]  escitalopram (LEXAPRO) 20 MG tablet Take 20 mg by mouth daily.   Yes [provider]  finasteride (PROSCAR) 5 MG tablet Take 5 mg by mouth daily.   Yes [provider]    glipiZIDE (GLUCOTROL) 5 MG tablet Take 5 mg by mouth daily before breakfast.   Yes [provider]  HYDROcodone-acetaminophen (NORCO/VICODIN) 5-325 MG per tablet Take 1 tablet by mouth every 6 (six) hours as needed for moderate pain.   Yes [provider]  insulin aspart protamine- aspart (NOVOLOG MIX 70/30) (70-30) 100 UNIT/ML injection Inject 25 Units into the skin 2 (two) times daily with a meal.   Yes [provider]  lisinopril (PRINIVIL,ZESTRIL) 5 MG tablet Take 5 mg by mouth daily. Take half a tab   Yes [provider]  metoprolol succinate (TOPROL-XL) 25 MG 24 hr tablet Take 25 mg by mouth daily.   Yes [provider]  niacin (SLO-NIACIN) 500 MG tablet Take 500 mg by mouth 2 (two) times daily at 10 AM and 5 PM.   Yes [provider]  omeprazole (PRILOSEC) 20 MG capsule Take 20 mg by mouth daily.   Yes [provider]  simvastatin (ZOCOR) 40 MG tablet Take 40 mg by mouth daily.   Yes [provider]  tamsulosin (FLOMAX) 0.4 MG CAPS capsule Take 0.4 mg by mouth daily.   Yes [provider]  Vitamin D, Ergocalciferol, (DRISDOL) 50000 units CAPS capsule Take  50,000 Units by mouth every 7 (seven) days.   Yes [provider]  warfarin (COUMADIN) 5 MG tablet Take 5 mg by mouth daily.   Yes [provider]    Family History History reviewed. No pertinent family history.  Social History Social History  Substance Use Topics  . Smoking status: Current Every Day Smoker    Types: Cigarettes  . Smokeless tobacco: Never Used  . Alcohol use No     Allergies   Bee venom; Cortisone; and Onion   Review of Systems Review of Systems ROS: Statement: All systems negative except as marked or noted in the HPI; Constitutional: Negative for fever and chills. ; ; Eyes: Negative for eye pain, redness and discharge. ; ; ENMT: Negative for ear pain, hoarseness, nasal congestion, sinus pressure and sore  throat. ; ; Cardiovascular: Negative for chest pain, palpitations, diaphoresis, dyspnea and peripheral edema. ; ; Respiratory: Negative for cough, wheezing and stridor. ; ; Gastrointestinal: Negative for nausea, vomiting, diarrhea, abdominal pain, blood in stool, hematemesis, jaundice and rectal bleeding. . ; ; Genitourinary: +left groin pain. Negative for dysuria, flank pain and hematuria. ; ; Genital:  No penile drainage or rash, no testicular pain or swelling, no scrotal rash or swelling. ;; Musculoskeletal: Negative for back pain and neck pain. Negative for swelling and trauma.; ; Skin: Negative for pruritus, rash, abrasions, blisters, bruising and skin lesion.; ; Neuro: Negative for headache, lightheadedness and neck stiffness. Negative for weakness, altered level of consciousness, altered mental status, extremity weakness, paresthesias, involuntary movement, seizure and syncope.       Physical Exam Updated Vital Signs BP 136/80   Pulse (!) 104   Temp 98 F (36.7 C) (Oral)   Resp 19   Ht 5\' 10"  (1.778 m)   Wt 93.4 kg (206 lb)   SpO2 97%   BMI 29.56 kg/m   Physical Exam 1010: Physical examination:  Nursing notes reviewed; Vital signs and O2 SAT reviewed;  Constitutional: Well developed, Well nourished, Well hydrated, In no acute distress; Head:  Normocephalic, atraumatic; Eyes: EOMI, PERRL, No scleral icterus; ENMT: Mouth and pharynx normal, Mucous membranes moist; Neck: Supple, Full range of motion, No lymphadenopathy; Cardiovascular: Regular rate and rhythm, No gallop; Respiratory: Breath sounds clear & equal bilaterally, No wheezes.  Speaking full sentences with ease, Normal respiratory effort/excursion; Chest: Nontender, Movement normal; Abdomen: Soft, Nontender, Nondistended, Normal bowel sounds. No ecchymosis. No palp masses. ; Genitourinary: No CVA tenderness; Spine:  No midline CS, TS, LS tenderness.;; Extremities: Pelvis stable. +TTP left inguinal area/proximal anterior thigh, no  ecchymosis, no erythema, no open wounds, no palp masses. Left hip NT. Pulses normal, NT left knee/ankle/foot. No edema, No calf edema or asymmetry.; Neuro: AA&Ox3, Major CN grossly intact.  Speech clear. No gross focal motor or sensory deficits in extremities.; Skin: Color normal, Warm, Dry.   ED Treatments / Results  Labs (all labs ordered are listed, but only abnormal results are displayed)   EKG  EKG Interpretation None       Radiology   Procedures Procedures (including critical care time)  Medications Ordered in ED Medications  phytonadione (VITAMIN K) 10 mg in dextrose 5 % 50 mL IVPB (not administered)  oxyCODONE-acetaminophen (PERCOCET/ROXICET) 5-325 MG per tablet 2 tablet (2 tablets Oral Given 07/06/17 1019)     Initial Impression / Assessment and Plan / ED Course  I have reviewed the triage vital signs and the nursing notes.  Pertinent labs & imaging results that were available during my  care of the patient were reviewed by me and considered in my medical decision making (see chart for details).  MDM Reviewed: previous chart, nursing note and vitals Reviewed previous: labs Interpretation: labs, CT scan and ultrasound Total time providing critical care: 30-74 minutes. This excludes time spent performing separately reportable procedures and services. Consults: general surgery and admitting MD   CRITICAL CARE Performed by: Alfonzo Feller Total critical care time: 35 minutes Critical care time was exclusive of separately billable procedures and treating other patients. Critical care was necessary to treat or prevent imminent or life-threatening deterioration. Critical care was time spent personally by me on the following activities: development of treatment plan with patient and/or surrogate as well as nursing, discussions with consultants, evaluation of patient's response to treatment, examination of patient, obtaining history from patient or surrogate, ordering  and performing treatments and interventions, ordering and review of laboratory studies, ordering and review of radiographic studies, pulse oximetry and re-evaluation of patient's condition.   Results for orders placed or performed during the hospital encounter of 07/06/17  Protime-INR  Result Value Ref Range   Prothrombin Time 82.2 (H) 11.4 - 15.2 seconds   INR 9.83 (HH)   CBC with Differential  Result Value Ref Range   WBC 7.9 4.0 - 10.5 K/uL   RBC 4.08 (L) 4.22 - 5.81 MIL/uL   Hemoglobin 12.5 (L) 13.0 - 17.0 g/dL   HCT 37.0 (L) 39.0 - 52.0 %   MCV 90.7 78.0 - 100.0 fL   MCH 30.6 26.0 - 34.0 pg   MCHC 33.8 30.0 - 36.0 g/dL   RDW 14.2 11.5 - 15.5 %   Platelets 152 150 - 400 K/uL   Neutrophils Relative % 64 %   Neutro Abs 5.1 1.7 - 7.7 K/uL   Lymphocytes Relative 25 %   Lymphs Abs 2.0 0.7 - 4.0 K/uL   Monocytes Relative 10 %   Monocytes Absolute 0.8 0.1 - 1.0 K/uL   Eosinophils Relative 1 %   Eosinophils Absolute 0.1 0.0 - 0.7 K/uL   Basophils Relative 0 %   Basophils Absolute 0.0 0.0 - 0.1 K/uL  Basic metabolic panel  Result Value Ref Range   Sodium 138 135 - 145 mmol/L   Potassium 3.4 (L) 3.5 - 5.1 mmol/L   Chloride 104 101 - 111 mmol/L   CO2 25 22 - 32 mmol/L   Glucose, Bld 150 (H) 65 - 99 mg/dL   BUN 16 6 - 20 mg/dL   Creatinine, Ser 1.50 (H) 0.61 - 1.24 mg/dL   Calcium 8.9 8.9 - 10.3 mg/dL   GFR calc non Af Amer 45 (L) >60 mL/min   GFR calc Af Amer 52 (L) >60 mL/min   Anion gap 9 5 - 15    [place] Art/ven Flow Abd Pelv Doppler Result Date: 07/06/2017 CLINICAL DATA:  Acute groin pain for 4 days on the left EXAM: SCROTAL ULTRASOUND DOPPLER ULTRASOUND OF THE TESTICLES TECHNIQUE: Complete ultrasound examination of the testicles, epididymis, and other scrotal structures was performed. Color and spectral Doppler ultrasound were also utilized to evaluate blood flow to the testicles. COMPARISON:  07/06/2017 CT FINDINGS: Right testicle Measurements: 4.0 x 2.1 x 3.1 cm. No mass or  microlithiasis visualized. Left testicle Measurements: 3.8 x 1.8 x 2.5 cm. No mass or microlithiasis visualized. Right epididymis:  Incidental small epididymal cyst measures 6 mm Left epididymis: Small minimally complex septated cyst measures 1 cm Hydrocele:  Small simple appearing bilateral hydroceles Varicocele:  None visualized. Pulsed Doppler interrogation  of both testes demonstrates normal low resistance arterial and venous waveforms bilaterally. IMPRESSION: No acute scrotal or testicular abnormality.  Negative for torsion. Incidental epididymal cysts. Small simple hydroceles Electronically Signed   By: Jerilynn Mages.  Shick M.D.   On: 07/06/2017 11:50   Ct Renal Stone Study Result Date: 07/06/2017 CLINICAL DATA:  Left-sided groin pain. EXAM: CT ABDOMEN AND PELVIS WITHOUT CONTRAST TECHNIQUE: Multidetector CT imaging of the abdomen and pelvis was performed following the standard protocol without IV contrast. COMPARISON:  02/07/2005 FINDINGS: Lower chest: Lung bases are clear. No effusions. Heart is normal size. Hepatobiliary: No focal hepatic abnormality. Gallbladder unremarkable. Pancreas: No focal abnormality or ductal dilatation. Spleen: No focal abnormality.  Normal size. Adrenals/Urinary Tract: No adrenal abnormality. No focal renal abnormality. No stones or hydronephrosis. Urinary bladder is unremarkable. Stomach/Bowel: Sigmoid and descending colonic diverticulosis. No active diverticulitis. Appendix is normal. Stomach and small bowel unremarkable. No obstruction. Vascular/Lymphatic: Diffuse aortic calcifications. Slight aneurysmal dilatation of the mid abdominal aorta, 3 cm maximally. No adenopathy. Reproductive: No visible focal abnormality. Other: No free fluid or free air. There is stranding noted in the left retroperitoneum and retroperitoneum between the left pelvic sidewall and the urinary bladder, extending superiorly anterior to the psoas and iliacus muscles compatible with small left retroperitoneal  hematoma. Musculoskeletal: No acute bony abnormality. IMPRESSION: Small, presumably spontaneous hematoma within the left retroperitoneum and extraperitoneal space. No renal or ureteral stones.  No hydronephrosis. Left colonic diverticulosis. Aortoiliac atherosclerosis. 3 cm mid abdominal aortic aneurysm. Recommend followup by ultrasound in 3 years. This recommendation follows ACR consensus guidelines: White Paper of the ACR Incidental Findings Committee II on Vascular Findings. Natasha Mead Coll Radiol 2013; [phone] Electronically Signed   By: Rolm Baptise M.D.   On: 07/06/2017 11:18    1210:  INR elevated with spontaneous retroperitoneal hematoma. Since pt is stable, and symptoms began 4 days ago, will not give FEIBA/Kcentra; IV Vit K 10mg  ordered. T&S obtained. T/C to General Surgery Dr. Arnoldo Morale, case discussed, including:  HPI, pertinent PM/SHx, VS/PE, dx testing, ED course and treatment:  Agrees with Vit K/FFP, no acute surgical issue, pt needs INR lowered and monitoring. Dx and testing d/w pt.  Questions answered.  Verb understanding, agreeable to admit.  1230:  T/C to [facility] Dr. Caryn Section, case discussed, including:  HPI, pertinent PM/SHx, VS/PE, dx testing, ED course and treatment:  Agreeable to admit, requests to write temporary orders, obtain tele bed.   Final Clinical Impressions(s) / ED Diagnoses   Final diagnoses:  Groin pain    New Prescriptions New Prescriptions   No medications on file     Francine Graven, DO 07/08/17 2201

## 2017-07-06 NOTE — ED Notes (Signed)
Pt eating [employer] upon entering the room.  Pt told for future that he is on carb modified diet.

## 2017-07-06 NOTE — H&P (Signed)
History and Physical    EDWORD CU RAQ:[id number] DOB: Aug 26, 1946 DOA: 07/06/2017  PCP: [facility] Patient coming from: Home  I have personally briefly reviewed patient's old medical records in [place]  Chief Complaint: Left groin pain  HPI: Rodney Moyer is a 71 y.o. male with medical history significant for bilateral PE in 2007-on warfarin, ?benign brain tumor necessitating prophylactic anti-seizure medication, CAD, HTN, chronic anxiety/depression, and anemia, who presents to the ED with the complaint of spontaneous left groin pain which started 4 days ago. Patient was laying in his recliner and then when he stood up, he felt a sharp pain in his left groin that radiated down to his knee. He describes the pain as sharp and stabbing and "12"/10 in pain intensity. He denies any recent lifting or trauma. The pain became progressively worse, causing difficulty standing, walking, and particularly lifting his left leg without exquisite pain. He went to a local urgent care and was diagnosed with arthritis and given a prescription for prednisone which he filled and started taking. However his pain did not subside. He denies fever, chills, chest pain, shortness of breath, cough, nausea, vomiting, pain with urination, or swelling in his lower legs. He does have left flank pain. He has chronic low back pain. He denies rectal bleeding, coughing of blood, vomiting of blood, or blood in his urine.  ED Course: In the ED, he was afebrile and hemodynamically stable. His lab data were significant for INR of 9.83 and PT of 82.2, hemoglobin of 12.5, potassium of 3.4, creatinine 1.5, and glucose of 150. Doppler ultrasound of the testes revealed no acute scrotal or testicular abnormality. Noncontrasted CT of the abdomen/pelvis revealed small presumably spontaneous hematoma within the left retroperitoneum and extraperitoneal space. He was admitted for further evaluation and  management.  Review of Systems: As per HPI otherwise 10 point review of systems negative.    Past Medical History:  Diagnosis Date  . Anemia   . Anxiety   . Benign prostate hyperplasia   . Coronary artery disease   . Depression   . Diabetes mellitus without complication ([place])    Type II  . Head injury    'years ago'  . Hypertension   . Pulmonary embolism [facility])     Past Surgical History:  Procedure Laterality Date  . BACK SURGERY    . CARDIAC CATHETERIZATION  2015  . CATARACT EXTRACTION Left 06/13/2017  . HERNIA REPAIR    . ROTATOR CUFF REPAIR  1/16    Social history: He reports that he has been smoking Cigarettes-14 cigarettes daily.  He has never used smokeless tobacco. He reports that he does not drink alcohol or use drugs. he stopped drinking alcohol many years ago.  Allergies  Allergen Reactions  . Bee Venom Swelling    Has epi pen available  . Cortisone Other (See Comments)    "Hiccups and belching - have to be put to sleep to get rid of"  . Onion Other (See Comments)    Nausea, vomiting, diarrhea    Family history: His father died of a heart attack at age 75. His mother had depression.   Prior to Admission medications   Medication Sig Start Date End Date Taking? Authorizing Provider  aspirin 81 MG tablet Take 81 mg by mouth daily.   Yes [provider]  clonazePAM (KLONOPIN) 0.5 MG tablet Take 0.5 mg by mouth 2 (two) times daily as needed for anxiety.  Yes [provider]  cyanocobalamin 1000 MCG tablet Take 100 mcg by mouth daily.   Yes [provider]  divalproex (DEPAKOTE) 500 MG DR tablet Take 1,500 mg by mouth at bedtime.   Yes [provider]  escitalopram (LEXAPRO) 20 MG tablet Take 20 mg by mouth daily.   Yes [provider]  finasteride (PROSCAR) 5 MG tablet Take 5 mg by mouth daily.   Yes [provider]  glipiZIDE (GLUCOTROL) 5 MG tablet Take 5 mg by mouth daily before breakfast.   Yes  [provider]  HYDROcodone-acetaminophen (NORCO/VICODIN) 5-325 MG per tablet Take 1 tablet by mouth every 6 (six) hours as needed for moderate pain.   Yes [provider]  insulin aspart protamine- aspart (NOVOLOG MIX 70/30) (70-30) 100 UNIT/ML injection Inject 25 Units into the skin 2 (two) times daily with a meal.   Yes [provider]  lisinopril (PRINIVIL,ZESTRIL) 5 MG tablet Take 5 mg by mouth daily. Take half a tab   Yes [provider]  metoprolol succinate (TOPROL-XL) 25 MG 24 hr tablet Take 25 mg by mouth daily.   Yes [provider]  niacin (SLO-NIACIN) 500 MG tablet Take 500 mg by mouth 2 (two) times daily at 10 AM and 5 PM.   Yes [provider]  omeprazole (PRILOSEC) 20 MG capsule Take 20 mg by mouth daily.   Yes [provider]  simvastatin (ZOCOR) 40 MG tablet Take 40 mg by mouth daily.   Yes [provider]  tamsulosin (FLOMAX) 0.4 MG CAPS capsule Take 0.4 mg by mouth daily.   Yes [provider]  Vitamin D, Ergocalciferol, (DRISDOL) 50000 units CAPS capsule Take 50,000 Units by mouth every 7 (seven) days.   Yes [provider]  warfarin (COUMADIN) 5 MG tablet Take 5 mg by mouth daily.   Yes [provider]    Physical Exam: Vitals:   07/06/17 1330 07/06/17 1343 07/06/17 1400 07/06/17 1430  BP: 109/68  115/67 (!) 130/96  Pulse: 76 73 72 73  Resp: 16 13 15 12   Temp:      TempSrc:      SpO2: 95% 97% 96% 98%  Weight:      Height:        Constitutional: NAD, calm, comfortable Vitals:   07/06/17 1330 07/06/17 1343 07/06/17 1400 07/06/17 1430  BP: 109/68  115/67 (!) 130/96  Pulse: 76 73 72 73  Resp: 16 13 15 12   Temp:      TempSrc:      SpO2: 95% 97% 96% 98%  Weight:      Height:       Eyes: PERRL, lids and conjunctivae normal ENMT: Mucous membranes are moist. Posterior pharynx clear of any exudate or lesions.Normal dentition.  Neck: normal, supple, no masses, no  thyromegaly Respiratory: clear to auscultation bilaterally, no wheezing, no crackles. Normal respiratory effort. No accessory muscle use.  Cardiovascular: Regular rate and rhythm, no murmurs / rubs / gallops. No extremity edema. 2+ pedal pulses. No carotid bruits.  Abdomen: Left flank/CVA tenderness with some fullness; no masses palpated. No hepatosplenomegaly. Bowel sounds positive. GU: Circumcised male; moderate tenderness in the left inguinal region with some fullness but no palpable mass. Musculoskeletal: no clubbing / cyanosis. No joint deformity upper and lower extremities. Decreased range of motion of the left lower extremity proximally secondary to pain-exquisite pain with attempted flexion and extension of hip; no contractures. Normal muscle tone.  Skin: no rashes, lesions, ulcers.  No induration Neurologic: CN 2-12 grossly intact. Sensation intact, DTR normal. Strength 5/5 in all 4.  Psychiatric: Normal judgment and insight. Alert and oriented x 3. Normal mood.     Labs on Admission: I have personally reviewed following labs and imaging studies  CBC:  Recent Labs Lab 07/06/17 1032  WBC 7.9  NEUTROABS 5.1  HGB 12.5*  HCT 37.0*  MCV 90.7  PLT 921   Basic Metabolic Panel:  Recent Labs Lab 07/06/17 1032  NA 138  K 3.4*  CL 104  CO2 25  GLUCOSE 150*  BUN 16  CREATININE 1.50*  CALCIUM 8.9   GFR: Estimated Creatinine Clearance: 51.9 mL/min (A) (by C-G formula based on SCr of 1.5 mg/dL (H)). Liver Function Tests: No results for input(s): AST, ALT, ALKPHOS, BILITOT, PROT, ALBUMIN in the last 168 hours. No results for input(s): LIPASE, AMYLASE in the last 168 hours. No results for input(s): AMMONIA in the last 168 hours. Coagulation Profile:  Recent Labs Lab 07/06/17 1032  INR 9.83*   Cardiac Enzymes: No results for input(s): CKTOTAL, CKMB, CKMBINDEX, TROPONINI in the last 168 hours. BNP (last 3 results) No results for input(s): PROBNP in the last 8760  hours. HbA1C: No results for input(s): HGBA1C in the last 72 hours. CBG: No results for input(s): GLUCAP in the last 168 hours. Lipid Profile: No results for input(s): CHOL, HDL, LDLCALC, TRIG, CHOLHDL, LDLDIRECT in the last 72 hours. Thyroid Function Tests: No results for input(s): TSH, T4TOTAL, FREET4, T3FREE, THYROIDAB in the last 72 hours. Anemia Panel: No results for input(s): VITAMINB12, FOLATE, FERRITIN, TIBC, IRON, RETICCTPCT in the last 72 hours. Urine analysis:    Component Value Date/Time   COLORURINE YELLOW 11/21/2007 2317   APPEARANCEUR CLEAR 11/21/2007 2317   LABSPEC 1.022 11/21/2007 2317   PHURINE 6.5 11/21/2007 2317   GLUCOSEU NEGATIVE 11/21/2007 2317   HGBUR TRACE (A) 11/21/2007 2317   BILIRUBINUR NEGATIVE 11/21/2007 2317   KETONESUR NEGATIVE 11/21/2007 2317   PROTEINUR NEGATIVE 11/21/2007 2317   UROBILINOGEN 0.2 11/21/2007 2317   NITRITE NEGATIVE 11/21/2007 2317   LEUKOCYTESUR NEGATIVE 11/21/2007 2317    Radiological Exams on Admission: US Scrotum  Result Date: 07/06/2017 CLINICAL DATA:  Acute groin pain for 4 days on the left EXAM: SCROTAL ULTRASOUND DOPPLER ULTRASOUND OF THE TESTICLES TECHNIQUE: Complete ultrasound examination of the testicles, epididymis, and other scrotal structures was performed. Color and spectral Doppler ultrasound were also utilized to evaluate blood flow to the testicles. COMPARISON:  07/06/2017 CT FINDINGS: Right testicle Measurements: 4.0 x 2.1 x 3.1 cm. No mass or microlithiasis visualized. Left testicle Measurements: 3.8 x 1.8 x 2.5 cm. No mass or microlithiasis visualized. Right epididymis:  Incidental small epididymal cyst measures 6 mm Left epididymis: Small minimally complex septated cyst measures 1 cm Hydrocele:  Small simple appearing bilateral hydroceles Varicocele:  None visualized. Pulsed Doppler interrogation of both testes demonstrates normal low resistance arterial and venous waveforms bilaterally. IMPRESSION: No acute scrotal  or testicular abnormality.  Negative for torsion. Incidental epididymal cysts. Small simple hydroceles Electronically Signed   By: Jerilynn Mages.  Shick M.D.   On: 07/06/2017 11:50   [place] Art/ven Flow Abd Pelv Doppler  Result Date: 07/06/2017 CLINICAL DATA:  Acute groin pain for 4 days on the left EXAM: SCROTAL ULTRASOUND DOPPLER ULTRASOUND OF THE TESTICLES TECHNIQUE: Complete ultrasound examination of the testicles, epididymis, and other scrotal structures was performed. Color and spectral Doppler ultrasound were also utilized to evaluate blood flow to the testicles. COMPARISON:  07/06/2017 CT FINDINGS:  Right testicle Measurements: 4.0 x 2.1 x 3.1 cm. No mass or microlithiasis visualized. Left testicle Measurements: 3.8 x 1.8 x 2.5 cm. No mass or microlithiasis visualized. Right epididymis:  Incidental small epididymal cyst measures 6 mm Left epididymis: Small minimally complex septated cyst measures 1 cm Hydrocele:  Small simple appearing bilateral hydroceles Varicocele:  None visualized. Pulsed Doppler interrogation of both testes demonstrates normal low resistance arterial and venous waveforms bilaterally. IMPRESSION: No acute scrotal or testicular abnormality.  Negative for torsion. Incidental epididymal cysts. Small simple hydroceles Electronically Signed   By: Jerilynn Mages.  Shick M.D.   On: 07/06/2017 11:50   Ct Renal Stone Study  Result Date: 07/06/2017 CLINICAL DATA:  Left-sided groin pain. EXAM: CT ABDOMEN AND PELVIS WITHOUT CONTRAST TECHNIQUE: Multidetector CT imaging of the abdomen and pelvis was performed following the standard protocol without IV contrast. COMPARISON:  02/07/2005 FINDINGS: Lower chest: Lung bases are clear. No effusions. Heart is normal size. Hepatobiliary: No focal hepatic abnormality. Gallbladder unremarkable. Pancreas: No focal abnormality or ductal dilatation. Spleen: No focal abnormality.  Normal size. Adrenals/Urinary Tract: No adrenal abnormality. No focal renal abnormality. No stones or  hydronephrosis. Urinary bladder is unremarkable. Stomach/Bowel: Sigmoid and descending colonic diverticulosis. No active diverticulitis. Appendix is normal. Stomach and small bowel unremarkable. No obstruction. Vascular/Lymphatic: Diffuse aortic calcifications. Slight aneurysmal dilatation of the mid abdominal aorta, 3 cm maximally. No adenopathy. Reproductive: No visible focal abnormality. Other: No free fluid or free air. There is stranding noted in the left retroperitoneum and retroperitoneum between the left pelvic sidewall and the urinary bladder, extending superiorly anterior to the psoas and iliacus muscles compatible with small left retroperitoneal hematoma. Musculoskeletal: No acute bony abnormality. IMPRESSION: Small, presumably spontaneous hematoma within the left retroperitoneum and extraperitoneal space. No renal or ureteral stones.  No hydronephrosis. Left colonic diverticulosis. Aortoiliac atherosclerosis. 3 cm mid abdominal aortic aneurysm. Recommend followup by ultrasound in 3 years. This recommendation follows ACR consensus guidelines: White Paper of the ACR Incidental Findings Committee II on Vascular Findings. Natasha Mead Coll Radiol 2013; [phone] Electronically Signed   By: Rolm Baptise M.D.   On: 07/06/2017 11:18    EKG: Not ordered  Assessment/Plan Principal Problem:   Nontraumatic retroperitoneal hematoma Active Problems:   Supratherapeutic INR   Warfarin-induced coagulopathy (HCC)   History of pulmonary embolus (PE)   Diabetes mellitus, type II (HCC)   Essential hypertension   Renal insufficiency   AAA (abdominal aortic aneurysm) ([place])   1. Spontaneous/nontraumatic retroperitoneal hematoma secondary to warfarin coagulopathy. CT of the abdomen/pelvis noted for the left retroperitoneal hematoma and supratherapeutic INR of 9.8. Patient is hemodynamically stable. -10 mg of vitamin K was given by the ED physician. Warfarin is being held. Aspirin will be held as well. -We'll  monitor INR daily. -Monitor CBC every 6 hours 24 hours and then daily thereafter. -EDP, Dr. Thurnell Garbe discussed the patient with general surgeon, Dr. Arnoldo Morale. Per Dr. Thurnell Garbe, Dr. Arnoldo Morale stated that there was no surgical need. Will reconsult him as needed. -Provide gentle IV fluids and supportive treatment.  History of pulmonary embolism. -The patient has a history of bilateral PE, dating back to approximately 2007. He had been treated on warfarin since that time. -Warfarin is on hold secondary to #1.  Incidental finding of 3 cm AAA. -Noted on CT. This will need to be monitored appropriately.  Renal insufficiency. The patient reports no known history of chronic kidney disease. His creatinine is 1.5. It could be prerenal azotemia in the setting of retroperitoneal bleeding and  ACE inhibitor treatment. -We'll hold ACE inhibitor overnight. Will gently hydrate. Will follow-up on his renal function 7/29.  CAD. Patient is treated chronically with aspirin, metoprolol, niacin, simvastatin, and lisinopril. I'll will be started eventually with the exception of aspirin due to retroperitoneal hematoma. Patient denies chest pain.  Essential hypertension. Patient is treated chronically with lisinopril and Toprol-XL. These will be restarted on 7/29. His blood pressure is stable.  Type 2 insulin-dependent diabetes mellitus. Patient is treated chronically with 70/30 insulin 25 units twice a day and glipizide 5 mg daily. These will be held. Will start sliding scale NovoLog and twice a day Levemir while in the hospital.  Prophylactic Depakote. Patient reports some type of benign tumor which necessitates prophylactic seizure treatment. Depakote will be continued.  Depression with anxiety. Patient is treated chronically with Lexapro and clonazepam. They will be restarted. Currently stable.   DVT prophylaxis: SCDs Code Status: Full code Family Communication: Family not available Disposition Plan:  Discharged home in a few days or when clinically appropriate Consults called: General surgery, Dr. Arnoldo Morale Admission status: Inpatient  Tamula Morrical MD [facility] Pager [phone]  If 7PM-7AM, please contact night-coverage [email or web address] Password [facility]  07/06/2017, 2:59 PM

## 2017-07-06 NOTE — ED Notes (Signed)
Pt in US

## 2017-07-06 NOTE — ED Triage Notes (Signed)
Pt c/o left hip/groin area x 4 days. Had xrays done 3 daya ago and was told arthirits and was put on steroids which is not helping. Pt grimacing. Denies gu changes

## 2017-07-06 NOTE — ED Notes (Signed)
Date and time results received: 07/06/17 1200   Test: INR Critical Value: 9.83  Name of Provider Notified: Dr. Thurnell Garbe Orders Received? Or Actions Taken?: No new orders given at this time.

## 2017-07-07 DIAGNOSIS — K661 Hemoperitoneum: Secondary | ICD-10-CM | POA: Diagnosis not present

## 2017-07-07 DIAGNOSIS — I714 Abdominal aortic aneurysm, without rupture: Secondary | ICD-10-CM | POA: Diagnosis not present

## 2017-07-07 DIAGNOSIS — D6832 Hemorrhagic disorder due to extrinsic circulating anticoagulants: Secondary | ICD-10-CM | POA: Diagnosis not present

## 2017-07-07 DIAGNOSIS — R791 Abnormal coagulation profile: Secondary | ICD-10-CM | POA: Diagnosis not present

## 2017-07-07 DIAGNOSIS — N289 Disorder of kidney and ureter, unspecified: Secondary | ICD-10-CM

## 2017-07-07 DIAGNOSIS — R1032 Left lower quadrant pain: Secondary | ICD-10-CM | POA: Diagnosis present

## 2017-07-07 DIAGNOSIS — Z86711 Personal history of pulmonary embolism: Secondary | ICD-10-CM | POA: Diagnosis not present

## 2017-07-07 DIAGNOSIS — I10 Essential (primary) hypertension: Secondary | ICD-10-CM | POA: Diagnosis not present

## 2017-07-07 LAB — HEPATIC FUNCTION PANEL
ALBUMIN: 3.5 g/dL (ref 3.5–5.0)
ALT: 12 U/L — ABNORMAL LOW (ref 17–63)
AST: 16 U/L (ref 15–41)
Alkaline Phosphatase: 33 U/L — ABNORMAL LOW (ref 38–126)
BILIRUBIN TOTAL: 0.8 mg/dL (ref 0.3–1.2)
Bilirubin, Direct: 0.2 mg/dL (ref 0.1–0.5)
Indirect Bilirubin: 0.6 mg/dL (ref 0.3–0.9)
TOTAL PROTEIN: 6.4 g/dL — AB (ref 6.5–8.1)

## 2017-07-07 LAB — BASIC METABOLIC PANEL
Anion gap: 8 (ref 5–15)
BUN: 17 mg/dL (ref 6–20)
CHLORIDE: 105 mmol/L (ref 101–111)
CO2: 25 mmol/L (ref 22–32)
Calcium: 8.5 mg/dL — ABNORMAL LOW (ref 8.9–10.3)
Creatinine, Ser: 1.15 mg/dL (ref 0.61–1.24)
GLUCOSE: 104 mg/dL — AB (ref 65–99)
POTASSIUM: 3.9 mmol/L (ref 3.5–5.1)
SODIUM: 138 mmol/L (ref 135–145)

## 2017-07-07 LAB — GLUCOSE, CAPILLARY
GLUCOSE-CAPILLARY: 157 mg/dL — AB (ref 65–99)
Glucose-Capillary: 116 mg/dL — ABNORMAL HIGH (ref 65–99)
Glucose-Capillary: 182 mg/dL — ABNORMAL HIGH (ref 65–99)

## 2017-07-07 LAB — CBC
HCT: 33.8 % — ABNORMAL LOW (ref 39.0–52.0)
HCT: 33.3 % — ABNORMAL LOW (ref 39.0–52.0)
HEMATOCRIT: 36.6 % — AB (ref 39.0–52.0)
HEMOGLOBIN: 11.5 g/dL — AB (ref 13.0–17.0)
HEMOGLOBIN: 11.3 g/dL — AB (ref 13.0–17.0)
Hemoglobin: 12.2 g/dL — ABNORMAL LOW (ref 13.0–17.0)
MCH: 30.9 pg (ref 26.0–34.0)
MCH: 30.8 pg (ref 26.0–34.0)
MCH: 30.4 pg (ref 26.0–34.0)
MCHC: 34 g/dL (ref 30.0–36.0)
MCHC: 33.9 g/dL (ref 30.0–36.0)
MCHC: 33.3 g/dL (ref 30.0–36.0)
MCV: 90.9 fL (ref 78.0–100.0)
MCV: 90.7 fL (ref 78.0–100.0)
MCV: 91.3 fL (ref 78.0–100.0)
PLATELETS: 154 10*3/uL (ref 150–400)
Platelets: 149 10*3/uL — ABNORMAL LOW (ref 150–400)
Platelets: 164 10*3/uL (ref 150–400)
RBC: 3.72 MIL/uL — AB (ref 4.22–5.81)
RBC: 3.67 MIL/uL — ABNORMAL LOW (ref 4.22–5.81)
RBC: 4.01 MIL/uL — ABNORMAL LOW (ref 4.22–5.81)
RDW: 14.1 % (ref 11.5–15.5)
RDW: 14.1 % (ref 11.5–15.5)
RDW: 13.8 % (ref 11.5–15.5)
WBC: 7.1 10*3/uL (ref 4.0–10.5)
WBC: 6 10*3/uL (ref 4.0–10.5)
WBC: 7.5 10*3/uL (ref 4.0–10.5)

## 2017-07-07 LAB — PROTIME-INR
INR: 1.38
Prothrombin Time: 17.1 seconds — ABNORMAL HIGH (ref 11.4–15.2)

## 2017-07-07 MED ORDER — NIACIN ER 500 MG PO TBCR
500.0000 mg | EXTENDED_RELEASE_TABLET | Freq: Two times a day (BID) | ORAL | Status: DC
  Administered 2017-07-07 – 2017-07-09 (×5): 500 mg via ORAL
  Filled 2017-07-07 (×7): qty 1

## 2017-07-07 MED ORDER — NIACIN ER 250 MG PO CPCR
500.0000 mg | ORAL_CAPSULE | Freq: Two times a day (BID) | ORAL | Status: DC
  Filled 2017-07-07 (×3): qty 1

## 2017-07-07 NOTE — Progress Notes (Signed)
PROGRESS NOTE    Rodney Moyer  PPI:[id number] DOB: November 01, 1946 DOA: 07/06/2017 PCP: Doctor in [place], [place]    Brief Narrative:   Rodney Moyer is a 71 y.o. male with medical history significant for bilateral PE in 2007-on warfarin, ?benign brain tumor necessitating prophylactic anti-seizure medication, CAD, HTN, chronic anxiety/depression, and anemia, who presented to the ED with the complaint of spontaneous left groin pain which started 4 days prior to admission. In the ED, he was afebrile and hemodynamically stable. His lab data were significant for INR of 9.83 and PT of 82.2, hemoglobin of 12.5, potassium of 3.4, creatinine 1.5, and glucose of 150. Doppler ultrasound of the testes revealed no acute scrotal or testicular abnormality. Noncontrasted CT of the abdomen/pelvis revealed small presumably spontaneous hematoma within the left retroperitoneum and extraperitoneal space. He was admitted for further evaluation and management.  Assessment & Plan:   Principal Problem:   Nontraumatic retroperitoneal hematoma Active Problems:   Supratherapeutic INR   Warfarin-induced coagulopathy (HCC)   History of pulmonary embolus (PE)   Diabetes mellitus, type II (HCC)   Essential hypertension   Renal insufficiency   AAA (abdominal aortic aneurysm) (HCC)   Left groin pain   1. Nontraumatic retroperitoneal hematoma in the setting of warfarin-induced coagulopathy. Patient's INR was 9.83 on admission. Radiographic evidence revealed left retro-peritoneal hematoma and hematoma in the extraperitoneal space. -10 mg of vitamin K was given by the EDP. - CBC and INR were ordered daily. -Follow-up INR today decreased significantly to 1.38. His hemoglobin has remained stable. -We'll continue to hold warfarin and will ask for hematology/oncology consult regarding the appropriateness of restarting anticoagulation.  History of bilateral PE. Patient has a history of bilateral PE, diagnosed in 2007. Upon further  questioning, he had a brother who died of a PE and other relatives who died of "blood clots". His sister is also on "a blood thinner" because her blood gets "too thick". He denies any known genetic abnormalities. Patient was told that he needed to be on warfarin indefinitely due to his positive family history. -We'll hold warfarin in the setting of bleeding. -We'll consult hematology/oncology for assistance in managing this patient; specifically the appropriateness of restarting anticoagulation.  Thrombocytopenia. The patient's platelet count transiently decreased but returned to within normal limits. We'll continue to follow.  Mild acute blood loss anemia.  The patient's hemoglobin was 12.5 on admission. It has trended down a little, but not significantly. -We'll continue to monitor.  Acute Renal insufficiency. The patient reported no known history of chronic kidney disease.Patient's creatinine was 1.5 on admission. -ACE inhibitor was held overnight. He was started on gentle hydration. His creatinine has normalized.  Type 2 diabetes mellitus. Patient is treated chronically with 70/30 insulin 25 units twice a day and glipizide 5 mg daily. They were withheld. Sliding scale NovoLog and twice a day Levemir was started while in the hospital. -His CBGs have been reasonable.  CAD/hypertension. Patient is treated chronically with aspirin, metoprolol, niacin, simvastatin, and lisinopril. All were restarted with the exception of aspirin due to retroperitoneal hematoma. Patient denies chest pain. He is hemodynamically stable.  Incidental finding of 3 cm AAA. -Noted on CT. This will need to be monitored appropriately in the outpatient setting.    DVT prophylaxis: SCDs. Code Status: Full code Family Communication:Family not available Disposition Plan: Discharge to home in the next 24-48 hours.   Consultants:   Hem/Onc pending  Procedures:   None  Antimicrobials:  None  Subjective: Patient still has pain over the left flank and left proximal leg, but less than yesterday.  Objective: Vitals:   07/06/17 2056 07/06/17 2059 07/07/17 0808 07/07/17 1400  BP:  127/85  (!) 144/74  Pulse:  79  90  Resp:  17  20  Temp:  98 F (36.7 C)  98 F (36.7 C)  TempSrc:  Oral  Oral  SpO2: 98% 97% 94% 98%  Weight:      Height:        Intake/Output Summary (Last 24 hours) at 07/07/17 1646 Last data filed at 07/07/17 1500  Gross per 24 hour  Intake             2995 ml  Output             3050 ml  Net              -55 ml   Filed Weights   07/06/17 0957 07/06/17 1538  Weight: 93.4 kg (206 lb) 96 kg (211 lb 10.3 oz)    Examination:  General exam: Appears calm and comfortable  Respiratory system: Clear to auscultation. Respiratory effort normal. Cardiovascular system: S1 & S2 heard, RRR. No JVD, murmurs, rubs, gallops or clicks. No pedal edema. Gastrointestinal system: Abdomen: Left flank/CVA with moderate tenderness and some fullness; no masses palpated. No hepatosplenomegaly. Bowel sounds  Central nervous system: Alert and oriented. No focal neurological deficits. Extremities: Slightly less discomfort with elevating and flexing the left leg/hip. Skin: No rashes, lesions or ulcers Psychiatry: Judgement and insight appear normal. Mood & affect appropriate.     Data Reviewed: I have personally reviewed following labs and imaging studies  CBC:  Recent Labs Lab 07/06/17 1032 07/06/17 1629 07/06/17 2301 07/07/17 0545 07/07/17 0914  WBC 7.9 7.0 7.1 6.0 7.5  NEUTROABS 5.1  --   --   --   --   HGB 12.5* 12.2* 11.5* 11.3* 12.2*  HCT 37.0* 35.8* 33.8* 33.3* 36.6*  MCV 90.7 91.6 90.9 90.7 91.3  PLT 152 146* 154 149* 703   Basic Metabolic Panel:  Recent Labs Lab 07/06/17 1032 07/07/17 0545  NA 138 138  K 3.4* 3.9  CL 104 105  CO2 25 25  GLUCOSE 150* 104*  BUN 16 17  CREATININE 1.50* 1.15  CALCIUM 8.9 8.5*   GFR: Estimated Creatinine  Clearance: 68.5 mL/min (by C-G formula based on SCr of 1.15 mg/dL). Liver Function Tests:  Recent Labs Lab 07/07/17 0545  AST 16  ALT 12*  ALKPHOS 33*  BILITOT 0.8  PROT 6.4*  ALBUMIN 3.5   No results for input(s): LIPASE, AMYLASE in the last 168 hours. No results for input(s): AMMONIA in the last 168 hours. Coagulation Profile:  Recent Labs Lab 07/06/17 1032 07/07/17 0545  INR 9.83* 1.38   Cardiac Enzymes: No results for input(s): CKTOTAL, CKMB, CKMBINDEX, TROPONINI in the last 168 hours. BNP (last 3 results) No results for input(s): PROBNP in the last 8760 hours. HbA1C: No results for input(s): HGBA1C in the last 72 hours. CBG:  Recent Labs Lab 07/06/17 1658 07/06/17 2037 07/07/17 0712 07/07/17 1139  GLUCAP 241* 191* 116* 182*   Lipid Profile: No results for input(s): CHOL, HDL, LDLCALC, TRIG, CHOLHDL, LDLDIRECT in the last 72 hours. Thyroid Function Tests: No results for input(s): TSH, T4TOTAL, FREET4, T3FREE, THYROIDAB in the last 72 hours. Anemia Panel: No results for input(s): VITAMINB12, FOLATE, FERRITIN, TIBC, IRON, RETICCTPCT in the last 72 hours. Sepsis Labs: No results for input(s): PROCALCITON,  LATICACIDVEN in the last 168 hours.  No results found for this or any previous visit (from the past 240 hour(s)).       Radiology Studies: US Scrotum  Result Date: 07/06/2017 CLINICAL DATA:  Acute groin pain for 4 days on the left EXAM: SCROTAL ULTRASOUND DOPPLER ULTRASOUND OF THE TESTICLES TECHNIQUE: Complete ultrasound examination of the testicles, epididymis, and other scrotal structures was performed. Color and spectral Doppler ultrasound were also utilized to evaluate blood flow to the testicles. COMPARISON:  07/06/2017 CT FINDINGS: Right testicle Measurements: 4.0 x 2.1 x 3.1 cm. No mass or microlithiasis visualized. Left testicle Measurements: 3.8 x 1.8 x 2.5 cm. No mass or microlithiasis visualized. Right epididymis:  Incidental small epididymal cyst  measures 6 mm Left epididymis: Small minimally complex septated cyst measures 1 cm Hydrocele:  Small simple appearing bilateral hydroceles Varicocele:  None visualized. Pulsed Doppler interrogation of both testes demonstrates normal low resistance arterial and venous waveforms bilaterally. IMPRESSION: No acute scrotal or testicular abnormality.  Negative for torsion. Incidental epididymal cysts. Small simple hydroceles Electronically Signed   By: Jerilynn Mages.  Shick M.D.   On: 07/06/2017 11:50   [place] Art/ven Flow Abd Pelv Doppler  Result Date: 07/06/2017 CLINICAL DATA:  Acute groin pain for 4 days on the left EXAM: SCROTAL ULTRASOUND DOPPLER ULTRASOUND OF THE TESTICLES TECHNIQUE: Complete ultrasound examination of the testicles, epididymis, and other scrotal structures was performed. Color and spectral Doppler ultrasound were also utilized to evaluate blood flow to the testicles. COMPARISON:  07/06/2017 CT FINDINGS: Right testicle Measurements: 4.0 x 2.1 x 3.1 cm. No mass or microlithiasis visualized. Left testicle Measurements: 3.8 x 1.8 x 2.5 cm. No mass or microlithiasis visualized. Right epididymis:  Incidental small epididymal cyst measures 6 mm Left epididymis: Small minimally complex septated cyst measures 1 cm Hydrocele:  Small simple appearing bilateral hydroceles Varicocele:  None visualized. Pulsed Doppler interrogation of both testes demonstrates normal low resistance arterial and venous waveforms bilaterally. IMPRESSION: No acute scrotal or testicular abnormality.  Negative for torsion. Incidental epididymal cysts. Small simple hydroceles Electronically Signed   By: Jerilynn Mages.  Shick M.D.   On: 07/06/2017 11:50   Ct Renal Stone Study  Result Date: 07/06/2017 CLINICAL DATA:  Left-sided groin pain. EXAM: CT ABDOMEN AND PELVIS WITHOUT CONTRAST TECHNIQUE: Multidetector CT imaging of the abdomen and pelvis was performed following the standard protocol without IV contrast. COMPARISON:  02/07/2005 FINDINGS: Lower chest:  Lung bases are clear. No effusions. Heart is normal size. Hepatobiliary: No focal hepatic abnormality. Gallbladder unremarkable. Pancreas: No focal abnormality or ductal dilatation. Spleen: No focal abnormality.  Normal size. Adrenals/Urinary Tract: No adrenal abnormality. No focal renal abnormality. No stones or hydronephrosis. Urinary bladder is unremarkable. Stomach/Bowel: Sigmoid and descending colonic diverticulosis. No active diverticulitis. Appendix is normal. Stomach and small bowel unremarkable. No obstruction. Vascular/Lymphatic: Diffuse aortic calcifications. Slight aneurysmal dilatation of the mid abdominal aorta, 3 cm maximally. No adenopathy. Reproductive: No visible focal abnormality. Other: No free fluid or free air. There is stranding noted in the left retroperitoneum and retroperitoneum between the left pelvic sidewall and the urinary bladder, extending superiorly anterior to the psoas and iliacus muscles compatible with small left retroperitoneal hematoma. Musculoskeletal: No acute bony abnormality. IMPRESSION: Small, presumably spontaneous hematoma within the left retroperitoneum and extraperitoneal space. No renal or ureteral stones.  No hydronephrosis. Left colonic diverticulosis. Aortoiliac atherosclerosis. 3 cm mid abdominal aortic aneurysm. Recommend followup by ultrasound in 3 years. This recommendation follows ACR consensus guidelines: White Paper of the ACR Incidental Findings  Committee II on Vascular Findings. [facility] Radiol 2013; [id number] Electronically Signed   By: Rolm Baptise M.D.   On: 07/06/2017 11:18        Scheduled Meds: . divalproex  1,500 mg Oral QHS  . docusate sodium  100 mg Oral BID  . escitalopram  20 mg Oral Daily  . finasteride  5 mg Oral Daily  . insulin aspart  0-15 Units Subcutaneous TID WC  . insulin aspart  0-5 Units Subcutaneous QHS  . insulin detemir  10 Units Subcutaneous BID  . lisinopril  5 mg Oral Daily  . metoprolol succinate  25 mg Oral  Daily  . niacin  500 mg Oral BID WC  . nicotine  14 mg Transdermal Daily  . pantoprazole  40 mg Oral Daily  . potassium chloride  20 mEq Oral BID  . simvastatin  40 mg Oral q1800  . tamsulosin  0.4 mg Oral Daily  . cyanocobalamin  100 mcg Oral Daily  . Vitamin D (Ergocalciferol)  50,000 Units Oral Q7 days   Continuous Infusions: . 0.9 % NaCl with KCl 20 mEq / L 10 mL/hr at 07/07/17 1452     LOS: 1 day    Time spent: 47 minutes    Rexene Alberts, MD [facility] Pager [phone]  If 7PM-7AM, please contact night-coverage [email or web address] Password [facility] 07/07/2017, 4:46 PM

## 2017-07-08 DIAGNOSIS — E118 Type 2 diabetes mellitus with unspecified complications: Secondary | ICD-10-CM | POA: Diagnosis not present

## 2017-07-08 DIAGNOSIS — T45515A Adverse effect of anticoagulants, initial encounter: Secondary | ICD-10-CM | POA: Diagnosis not present

## 2017-07-08 DIAGNOSIS — Z794 Long term (current) use of insulin: Secondary | ICD-10-CM

## 2017-07-08 DIAGNOSIS — I10 Essential (primary) hypertension: Secondary | ICD-10-CM | POA: Diagnosis not present

## 2017-07-08 DIAGNOSIS — D6832 Hemorrhagic disorder due to extrinsic circulating anticoagulants: Secondary | ICD-10-CM | POA: Diagnosis not present

## 2017-07-08 DIAGNOSIS — K661 Hemoperitoneum: Secondary | ICD-10-CM | POA: Diagnosis not present

## 2017-07-08 LAB — GLUCOSE, CAPILLARY
GLUCOSE-CAPILLARY: 132 mg/dL — AB (ref 65–99)
GLUCOSE-CAPILLARY: 227 mg/dL — AB (ref 65–99)
GLUCOSE-CAPILLARY: 188 mg/dL — AB (ref 65–99)
Glucose-Capillary: 173 mg/dL — ABNORMAL HIGH (ref 65–99)
Glucose-Capillary: 182 mg/dL — ABNORMAL HIGH (ref 65–99)

## 2017-07-08 LAB — BASIC METABOLIC PANEL
ANION GAP: 6 (ref 5–15)
BUN: 15 mg/dL (ref 6–20)
CHLORIDE: 103 mmol/L (ref 101–111)
CO2: 27 mmol/L (ref 22–32)
CREATININE: 1.14 mg/dL (ref 0.61–1.24)
Calcium: 8.9 mg/dL (ref 8.9–10.3)
GFR calc non Af Amer: >60 mL/min (ref >60)
Glucose, Bld: 109 mg/dL — ABNORMAL HIGH (ref 65–99)
POTASSIUM: 3.9 mmol/L (ref 3.5–5.1)
Sodium: 136 mmol/L (ref 135–145)

## 2017-07-08 LAB — CBC
HCT: 34 % — ABNORMAL LOW (ref 39.0–52.0)
HEMOGLOBIN: 11.6 g/dL — AB (ref 13.0–17.0)
MCH: 30.9 pg (ref 26.0–34.0)
MCHC: 34.1 g/dL (ref 30.0–36.0)
MCV: 90.7 fL (ref 78.0–100.0)
Platelets: 163 10*3/uL (ref 150–400)
RBC: 3.75 MIL/uL — AB (ref 4.22–5.81)
RDW: 13.8 % (ref 11.5–15.5)
WBC: 5.7 10*3/uL (ref 4.0–10.5)

## 2017-07-08 LAB — ANTITHROMBIN III: AntiThromb III Func: 94 % (ref 75–120)

## 2017-07-08 LAB — PROTIME-INR
INR: 1.22
PROTHROMBIN TIME: 15.5 s — AB (ref 11.4–15.2)

## 2017-07-08 NOTE — Consult Note (Signed)
Consulted to see patient regarding anticoagulation recommendations. When I went to see the patient he stated " I dont know why you're here since I told them I'm not going to change anything". He wants to continue to follow up with his PCP for management of anticoagulation. He refuses to switch from warfarin to a direct thrombin inhibitor. He does not want to follow up with [place] as an outpatient.  Maintain INR 2-3 while on coumadin.   Twana First, MD

## 2017-07-08 NOTE — Progress Notes (Signed)
[facility] PROGRESS NOTE    Progress Note  Rodney Moyer  EXB:[id number] DOB: [birth year] DOA: 07/06/2017 PCP: System, Pcp Not In     Brief Narrative:   Rodney Moyer is an 71 y.o. male past medical history significant for bilateral PE in 2007 on Coumadin, with brain tumor on prophylactic AED, chronic anxiety and depression who presents to the ED complaining of spontaneous left groin pain which started 4 days prior to admission.  Assessment/Plan:   Spontaneousretroperitoneal hematoma In the setting of warfarin-induced coagulopathy/Supratherapeutic INR: CT of the abdomen on 06/28/2017 showed retroperitoneal hematoma. She was given vitamin K in the ED. INR on 07/07/2017 was 1.3, hemoglobin has been stable. Awaiting oncology recommendations. She's probably not a candidate for warfarin due to continuous retrograde tail hematoma. Patient has a demeanor against physicians, and he relates he is not wasting his money in seeing any more physicians. He relates he's never had genetic testing. We'll check hypercoagulable panel.  History of bilateral PE: Awaiting hematology's recommendations.  Diabetes mellitus, type II ([place]): Oral hypoglycemic and insulin were held on admission. She was started on Levemir twice a day plus sliding scale.  Thrombocytopenia: It has normalized.  Normocytic anemia: Stable.  Acute kidney injury: Likely prerenal in etiology in the setting of ACE inhibitor use.  Essential hypertension  Renal insufficiency  AAA (abdominal aortic aneurysm) ([place]) Continue follow-up as an outpatient.   DVT prophylaxis: SCD's Family Communication: none Disposition Plan/Barrier to D/C: hopefully in the am Code Status:     Code Status Orders        Start     Ordered   07/06/17 1447  Full code  Continuous     07/06/17 1449    Code Status History    Date Active Date Inactive Code Status Order ID Comments User Context   This patient has a current code status but  no historical code status.        IV Access:    Peripheral IV   Procedures and diagnostic studies:   US Scrotum  Result Date: 07/06/2017 CLINICAL DATA:  Acute groin pain for 4 days on the left EXAM: SCROTAL ULTRASOUND DOPPLER ULTRASOUND OF THE TESTICLES TECHNIQUE: Complete ultrasound examination of the testicles, epididymis, and other scrotal structures was performed. Color and spectral Doppler ultrasound were also utilized to evaluate blood flow to the testicles. COMPARISON:  07/06/2017 CT FINDINGS: Right testicle Measurements: 4.0 x 2.1 x 3.1 cm. No mass or microlithiasis visualized. Left testicle Measurements: 3.8 x 1.8 x 2.5 cm. No mass or microlithiasis visualized. Right epididymis:  Incidental small epididymal cyst measures 6 mm Left epididymis: Small minimally complex septated cyst measures 1 cm Hydrocele:  Small simple appearing bilateral hydroceles Varicocele:  None visualized. Pulsed Doppler interrogation of both testes demonstrates normal low resistance arterial and venous waveforms bilaterally. IMPRESSION: No acute scrotal or testicular abnormality.  Negative for torsion. Incidental epididymal cysts. Small simple hydroceles Electronically Signed   By: Jerilynn Mages.  Shick M.D.   On: 07/06/2017 11:50   [place] Art/ven Flow Abd Pelv Doppler  Result Date: 07/06/2017 CLINICAL DATA:  Acute groin pain for 4 days on the left EXAM: SCROTAL ULTRASOUND DOPPLER ULTRASOUND OF THE TESTICLES TECHNIQUE: Complete ultrasound examination of the testicles, epididymis, and other scrotal structures was performed. Color and spectral Doppler ultrasound were also utilized to evaluate blood flow to the testicles. COMPARISON:  07/06/2017 CT FINDINGS: Right testicle Measurements: 4.0 x 2.1 x 3.1 cm. No mass or microlithiasis visualized. Left testicle Measurements: 3.8 x 1.8  x 2.5 cm. No mass or microlithiasis visualized. Right epididymis:  Incidental small epididymal cyst measures 6 mm Left epididymis: Small minimally complex  septated cyst measures 1 cm Hydrocele:  Small simple appearing bilateral hydroceles Varicocele:  None visualized. Pulsed Doppler interrogation of both testes demonstrates normal low resistance arterial and venous waveforms bilaterally. IMPRESSION: No acute scrotal or testicular abnormality.  Negative for torsion. Incidental epididymal cysts. Small simple hydroceles Electronically Signed   By: Jerilynn Mages.  Shick M.D.   On: 07/06/2017 11:50   Ct Renal Stone Study  Result Date: 07/06/2017 CLINICAL DATA:  Left-sided groin pain. EXAM: CT ABDOMEN AND PELVIS WITHOUT CONTRAST TECHNIQUE: Multidetector CT imaging of the abdomen and pelvis was performed following the standard protocol without IV contrast. COMPARISON:  02/07/2005 FINDINGS: Lower chest: Lung bases are clear. No effusions. Heart is normal size. Hepatobiliary: No focal hepatic abnormality. Gallbladder unremarkable. Pancreas: No focal abnormality or ductal dilatation. Spleen: No focal abnormality.  Normal size. Adrenals/Urinary Tract: No adrenal abnormality. No focal renal abnormality. No stones or hydronephrosis. Urinary bladder is unremarkable. Stomach/Bowel: Sigmoid and descending colonic diverticulosis. No active diverticulitis. Appendix is normal. Stomach and small bowel unremarkable. No obstruction. Vascular/Lymphatic: Diffuse aortic calcifications. Slight aneurysmal dilatation of the mid abdominal aorta, 3 cm maximally. No adenopathy. Reproductive: No visible focal abnormality. Other: No free fluid or free air. There is stranding noted in the left retroperitoneum and retroperitoneum between the left pelvic sidewall and the urinary bladder, extending superiorly anterior to the psoas and iliacus muscles compatible with small left retroperitoneal hematoma. Musculoskeletal: No acute bony abnormality. IMPRESSION: Small, presumably spontaneous hematoma within the left retroperitoneum and extraperitoneal space. No renal or ureteral stones.  No hydronephrosis. Left  colonic diverticulosis. Aortoiliac atherosclerosis. 3 cm mid abdominal aortic aneurysm. Recommend followup by ultrasound in 3 years. This recommendation follows ACR consensus guidelines: White Paper of the ACR Incidental Findings Committee II on Vascular Findings. [facility] Coll Radiol 2013; [phone] Electronically Signed   By: Rolm Baptise M.D.   On: 07/06/2017 11:18     Medical Consultants:    None.  Anti-Infectives:   No complains  Subjective:    Rodney Moyer he relates no plaque stools or bright red blood per rectum no further complaints except that he does not want to see an oncologist as an outpatient.  Objective:    Vitals:   07/07/17 1400 07/07/17 2144 07/07/17 2213 07/08/17 0527  BP: (!) 144/74 137/82  139/69  Pulse: 90 83  82  Resp: 20 20  16   Temp: 98 F (36.7 C) 98 F (36.7 C)  98.1 F (36.7 C)  TempSrc: Oral Oral  Oral  SpO2: 98% 92% 98% 97%  Weight:      Height:        Intake/Output Summary (Last 24 hours) at 07/08/17 0814 Last data filed at 07/08/17 0616  Gross per 24 hour  Intake           1819.5 ml  Output             1925 ml  Net           -105.5 ml   Filed Weights   07/06/17 0957 07/06/17 1538  Weight: 93.4 kg (206 lb) 96 kg (211 lb 10.3 oz)    Exam: General exam: In no acute distress. Respiratory system: Good air movement clear to auscultation. Cardiovascular system: Regular rate and rhythm with positive S1-S2. Gastrointestinal system: Positive bowel sounds soft nontender nondistended. Central nervous system: Awake alert and oriented  3. Extremities: No lower extremity edema. Skin: No rashes. Psychiatry: Judgment and insight appear normal.    Data Reviewed:    Labs: Basic Metabolic Panel:  Recent Labs Lab 07/06/17 1032 07/07/17 0545  NA 138 138  K 3.4* 3.9  CL 104 105  CO2 25 25  GLUCOSE 150* 104*  BUN 16 17  CREATININE 1.50* 1.15  CALCIUM 8.9 8.5*   GFR Estimated Creatinine Clearance: 68.5 mL/min (by C-G formula based on  SCr of 1.15 mg/dL). Liver Function Tests:  Recent Labs Lab 07/07/17 0545  AST 16  ALT 12*  ALKPHOS 33*  BILITOT 0.8  PROT 6.4*  ALBUMIN 3.5   No results for input(s): LIPASE, AMYLASE in the last 168 hours. No results for input(s): AMMONIA in the last 168 hours. Coagulation profile  Recent Labs Lab 07/06/17 1032 07/07/17 0545  INR 9.83* 1.38    CBC:  Recent Labs Lab 07/06/17 1032 07/06/17 1629 07/06/17 2301 07/07/17 0545 07/07/17 0914  WBC 7.9 7.0 7.1 6.0 7.5  NEUTROABS 5.1  --   --   --   --   HGB 12.5* 12.2* 11.5* 11.3* 12.2*  HCT 37.0* 35.8* 33.8* 33.3* 36.6*  MCV 90.7 91.6 90.9 90.7 91.3  PLT 152 146* 154 149* 164   Cardiac Enzymes: No results for input(s): CKTOTAL, CKMB, CKMBINDEX, TROPONINI in the last 168 hours. BNP (last 3 results) No results for input(s): PROBNP in the last 8760 hours. CBG:  Recent Labs Lab 07/06/17 2037 07/07/17 0712 07/07/17 1139 07/07/17 2134 07/08/17 0739  GLUCAP 191* 116* 182* 157* 132*   D-Dimer: No results for input(s): DDIMER in the last 72 hours. Hgb A1c: No results for input(s): HGBA1C in the last 72 hours. Lipid Profile: No results for input(s): CHOL, HDL, LDLCALC, TRIG, CHOLHDL, LDLDIRECT in the last 72 hours. Thyroid function studies: No results for input(s): TSH, T4TOTAL, T3FREE, THYROIDAB in the last 72 hours.  Invalid input(s): FREET3 Anemia work up: No results for input(s): VITAMINB12, FOLATE, FERRITIN, TIBC, IRON, RETICCTPCT in the last 72 hours. Sepsis Labs:  Recent Labs Lab 07/06/17 1629 07/06/17 2301 07/07/17 0545 07/07/17 0914  WBC 7.0 7.1 6.0 7.5   Microbiology No results found for this or any previous visit (from the past 240 hour(s)).   Medications:   . divalproex  1,500 mg Oral QHS  . docusate sodium  100 mg Oral BID  . escitalopram  20 mg Oral Daily  . finasteride  5 mg Oral Daily  . insulin aspart  0-15 Units Subcutaneous TID WC  . insulin aspart  0-5 Units Subcutaneous QHS  .  insulin detemir  10 Units Subcutaneous BID  . lisinopril  5 mg Oral Daily  . metoprolol succinate  25 mg Oral Daily  . niacin  500 mg Oral BID WC  . nicotine  14 mg Transdermal Daily  . pantoprazole  40 mg Oral Daily  . simvastatin  40 mg Oral q1800  . tamsulosin  0.4 mg Oral Daily  . cyanocobalamin  100 mcg Oral Daily  . Vitamin D (Ergocalciferol)  50,000 Units Oral Q7 days   Continuous Infusions: . 0.9 % NaCl with KCl 20 mEq / L Stopped (07/07/17 2357)      LOS: 1 day   Rodney Moyer  [facility] Pager [phone]  *Please refer to [email or web address], password [email or web address] to get updated schedule on who will round on this patient, as hospitalists switch teams weekly. If 7PM-7AM, please contact night-coverage at [email or web address], password [email or web address] for any overnight  needs.  07/08/2017, 8:14 AM

## 2017-07-08 NOTE — Care Management Note (Signed)
Case Management Note  Patient Details  Name: Rodney Moyer MRN: [id number] Date of Birth: May 05, 1946  Subjective/Objective:   Adm with nontraumatic retroperitoneal hematoma. From home, ind PTA.  Listed as not having a PCP, patient reports he has PCP in [facility]. Still drives. No HH PTA. PT singed off, no recommendation.               Action/Plan: Patient plans to return home with self care. Declines any home health needs. Wants to be discharged.   Expected Discharge Date:  07/08/17               Expected Discharge Plan:  Home/Self Care  In-House Referral:     Discharge planning Services  CM Consult  Post Acute Care Choice:  NA Choice offered to:  NA  DME Arranged:    DME Agency:     HH Arranged:    HH Agency:     Status of Service:  Completed, signed off  If discussed at [employer] of Stay Meetings, dates discussed:    Additional Comments:  Latecia Miler, Chauncey Reading, RN 07/08/2017, 11:37 AM

## 2017-07-08 NOTE — Progress Notes (Signed)
Pt has his home CPAP unit and states he doesn't need my assistance to put it on. Machine checked for water and masked handed to pt.

## 2017-07-08 NOTE — Progress Notes (Signed)
PT Cancellation Note  Patient Details Name: Rodney Moyer MRN: [id number] DOB: 10/18/1946   Cancelled Treatment:    Reason Eval/Treat Not Completed: PT screened, no needs identified, will sign off. Pt AMB >531ft s A/E, no LOB, denies acute weakness or other acute abnormality. Pt performs all mobility independently. LLE pain now at 6/10 but not limiting to mobility. Pt denies need to PT at DC and denies need for any additional DME. PT signing off.   10:08 AM, 07/08/17 Etta Grandchild, PT, DPT Physical Therapist - [place] [phone] (Office)    Buccola,Allan C 07/08/2017, 10:07 AM

## 2017-07-09 DIAGNOSIS — I10 Essential (primary) hypertension: Secondary | ICD-10-CM

## 2017-07-09 DIAGNOSIS — K661 Hemoperitoneum: Secondary | ICD-10-CM

## 2017-07-09 LAB — LUPUS ANTICOAGULANT PANEL
DRVVT: 46.7 s (ref 0.0–47.0)
PTT LA: 33.7 s (ref 0.0–51.9)

## 2017-07-09 LAB — BETA-2-GLYCOPROTEIN I ABS, IGG/M/A
Beta-2 Glyco I IgG: <9 GPI IgG units (ref 0–20)
Beta-2-Glycoprotein I IgA: <9 GPI IgA units (ref 0–25)
Beta-2-Glycoprotein I IgM: <9 GPI IgM units (ref 0–32)

## 2017-07-09 LAB — GLUCOSE, CAPILLARY
GLUCOSE-CAPILLARY: 147 mg/dL — AB (ref 65–99)
Glucose-Capillary: 192 mg/dL — ABNORMAL HIGH (ref 65–99)

## 2017-07-09 LAB — PROTEIN C, TOTAL: PROTEIN C, TOTAL: 139 % (ref 60–150)

## 2017-07-09 LAB — PROTEIN S ACTIVITY: Protein S Activity: 57 % — ABNORMAL LOW (ref 63–140)

## 2017-07-09 LAB — PROTEIN C ACTIVITY: PROTEIN C ACTIVITY: 118 % (ref 73–180)

## 2017-07-09 LAB — PROTIME-INR
INR: 1.11
PROTHROMBIN TIME: 14.3 s (ref 11.4–15.2)

## 2017-07-09 LAB — PROTEIN S, TOTAL: PROTEIN S AG TOTAL: 58 % — AB (ref 60–150)

## 2017-07-09 LAB — HOMOCYSTEINE: Homocysteine: 20.5 umol/L — ABNORMAL HIGH (ref 0.0–15.0)

## 2017-07-09 NOTE — Progress Notes (Signed)
Sheppard Plumber discharged Home per MD order.  Discharge instructions reviewed and discussed with the patient, all questions and concerns answered. Copy of instructions given to patient.  Allergies as of 07/09/2017      Reactions   Bee Venom Swelling   Has epi pen available   Cortisone Other (See Comments)   "Hiccups and belching - have to be put to sleep to get rid of"   Onion Other (See Comments)   Nausea, vomiting, diarrhea      Medication List    TAKE these medications   aspirin 81 MG tablet Take 81 mg by mouth daily.   clonazePAM 0.5 MG tablet Commonly known as:  KLONOPIN Take 0.5 mg by mouth 2 (two) times daily as needed for anxiety.   cyanocobalamin 1000 MCG tablet Take 100 mcg by mouth daily.   divalproex 500 MG DR tablet Commonly known as:  DEPAKOTE Take 1,500 mg by mouth at bedtime.   escitalopram 20 MG tablet Commonly known as:  LEXAPRO Take 20 mg by mouth daily.   finasteride 5 MG tablet Commonly known as:  PROSCAR Take 5 mg by mouth daily.   glipiZIDE 5 MG tablet Commonly known as:  GLUCOTROL Take 5 mg by mouth daily before breakfast.   HYDROcodone-acetaminophen 5-325 MG tablet Commonly known as:  NORCO/VICODIN Take 1 tablet by mouth every 6 (six) hours as needed for moderate pain.   insulin aspart protamine- aspart (70-30) 100 UNIT/ML injection Commonly known as:  NOVOLOG MIX 70/30 Inject 25 Units into the skin 2 (two) times daily with a meal.   lisinopril 5 MG tablet Commonly known as:  PRINIVIL,ZESTRIL Take 5 mg by mouth daily. Take half a tab   metoprolol succinate 25 MG 24 hr tablet Commonly known as:  TOPROL-XL Take 25 mg by mouth daily.   niacin 500 MG tablet Commonly known as:  SLO-NIACIN Take 500 mg by mouth 2 (two) times daily at 10 AM and 5 PM.   omeprazole 20 MG capsule Commonly known as:  PRILOSEC Take 20 mg by mouth daily.   simvastatin 40 MG tablet Commonly known as:  ZOCOR Take 40 mg by mouth daily.   tamsulosin 0.4 MG Caps  capsule Commonly known as:  FLOMAX Take 0.4 mg by mouth daily.   Vitamin D (Ergocalciferol) 50000 units Caps capsule Commonly known as:  DRISDOL Take 50,000 Units by mouth every 7 (seven) days.   warfarin 5 MG tablet Commonly known as:  COUMADIN Take 5 mg by mouth daily.       Patients skin is clean, dry and intact, no evidence of skin break down. IV site discontinued and catheter remains intact. Site without signs and symptoms of complications. Dressing and pressure applied.  Patient escorted to car by NT in a wheelchair,  no distress noted upon discharge.  Ralene Muskrat Dayanne Yiu 07/09/2017 1:34 PM

## 2017-07-09 NOTE — Discharge Summary (Signed)
Physician Discharge Summary  Rodney Moyer IWL:[id number] DOB: 1946/09/13 DOA: 07/06/2017  PCP: System, Pcp Not In  Admit date: 07/06/2017 Discharge date: 07/09/2017  Admitted From: Home.  Disposition:  To home.   Recommendations for Outpatient Follow-up:  1. Follow up with PCP in 1 at the [place].  2. Please obtain BMP/CBC and INR later this week.    Home Health: none.  Equipment/Devices: None.  Discharge Condition: Stable.  CODE STATUS: FULL CODE.  Diet recommendation: cardiac.  Brief/Interim Summary:  Patient was admitted by Dr Caryn Section on July 06, 2017 for hematoma with supra therapeutic INF.   As per her H and P:  " Rodney Moyer is a 70 y.o. male with medical history significant for bilateral PE in 2007-on warfarin, ?benign brain tumor necessitating prophylactic anti-seizure medication, CAD, HTN, chronic anxiety/depression, and anemia, who presents to the ED with the complaint of spontaneous left groin pain which started 4 days ago. Patient was laying in his recliner and then when he stood up, he felt a sharp pain in his left groin that radiated down to his knee. He describes the pain as sharp and stabbing and "12"/10 in pain intensity. He denies any recent lifting or trauma. The pain became progressively worse, causing difficulty standing, walking, and particularly lifting his left leg without exquisite pain. He went to a local urgent care and was diagnosed with arthritis and given a prescription for prednisone which he filled and started taking. However his pain did not subside. He denies fever, chills, chest pain, shortness of breath, cough, nausea, vomiting, pain with urination, or swelling in his lower legs. He does have left flank pain. He has chronic low back pain. He denies rectal bleeding, coughing of blood, vomiting of blood, or blood in his urine.  ED Course: In the ED, he was afebrile and hemodynamically stable. His lab data were significant for INR of 9.83 and PT of 82.2, hemoglobin of  12.5, potassium of 3.4, creatinine 1.5, and glucose of 150. Doppler ultrasound of the testes revealed no acute scrotal or testicular abnormality. Noncontrasted CT of the abdomen/pelvis revealed small presumably spontaneous hematoma within the left retroperitoneum and extraperitoneal space. He was admitted for further evaluation and management.  Review of Systems: As per HPI otherwise 10 point review of systems negative.   HOSPITAL COURSE:  Patient was admitted and his INR of 9.8 was reversed with Vit K.  He expressed clearly that he doesn't want any advise from any physician other than his doctor at the [place].  Heme/Onc was consulted, and as noted, he has no interest in any recommendation.  His INR normalized, and his labs showed elevated homocysteine, along with slightly low acitivity of Protein S.  However, as noted, he expressed distrust of any physician other than his own at the [place].  I had hx of thromboembolic disease, and he was told that our hematologist recommended INR between 2-3.  He will resume his coumadin and follow up with his physician.  He insisted on leaving today.  His hematoma happened in the setting of supratherapeutic coumadin.  Therefore, it is in hope that if he remains in his correct range, perhaps he would not have any spontaneous bleed.    Discharge Diagnoses:  Principal Problem:   Nontraumatic retroperitoneal hematoma Active Problems:   Supratherapeutic INR   History of pulmonary embolus (PE)   Diabetes mellitus, type II (HCC)   Essential hypertension   Warfarin-induced coagulopathy (HCC)   Renal insufficiency   AAA (abdominal aortic  aneurysm) ([place])   Left groin pain    Discharge Instructions  Discharge Instructions    Diet - low sodium heart healthy    Complete by:  As directed    Discharge instructions    Complete by:  As directed    Follow up with your doctor at the [place].  Keep INR between 2 and 3 by Dr authorization.   Increase activity slowly    Complete by:   As directed      Allergies as of 07/09/2017      Reactions   Bee Venom Swelling   Has epi pen available   Cortisone Other (See Comments)   "Hiccups and belching - have to be put to sleep to get rid of"   Onion Other (See Comments)   Nausea, vomiting, diarrhea      Medication List    TAKE these medications   aspirin 81 MG tablet Take 81 mg by mouth daily.   clonazePAM 0.5 MG tablet Commonly known as:  KLONOPIN Take 0.5 mg by mouth 2 (two) times daily as needed for anxiety.   cyanocobalamin 1000 MCG tablet Take 100 mcg by mouth daily.   divalproex 500 MG DR tablet Commonly known as:  DEPAKOTE Take 1,500 mg by mouth at bedtime.   escitalopram 20 MG tablet Commonly known as:  LEXAPRO Take 20 mg by mouth daily.   finasteride 5 MG tablet Commonly known as:  PROSCAR Take 5 mg by mouth daily.   glipiZIDE 5 MG tablet Commonly known as:  GLUCOTROL Take 5 mg by mouth daily before breakfast.   HYDROcodone-acetaminophen 5-325 MG tablet Commonly known as:  NORCO/VICODIN Take 1 tablet by mouth every 6 (six) hours as needed for moderate pain.   insulin aspart protamine- aspart (70-30) 100 UNIT/ML injection Commonly known as:  NOVOLOG MIX 70/30 Inject 25 Units into the skin 2 (two) times daily with a meal.   lisinopril 5 MG tablet Commonly known as:  PRINIVIL,ZESTRIL Take 5 mg by mouth daily. Take half a tab   metoprolol succinate 25 MG 24 hr tablet Commonly known as:  TOPROL-XL Take 25 mg by mouth daily.   niacin 500 MG tablet Commonly known as:  SLO-NIACIN Take 500 mg by mouth 2 (two) times daily at 10 AM and 5 PM.   omeprazole 20 MG capsule Commonly known as:  PRILOSEC Take 20 mg by mouth daily.   simvastatin 40 MG tablet Commonly known as:  ZOCOR Take 40 mg by mouth daily.   tamsulosin 0.4 MG Caps capsule Commonly known as:  FLOMAX Take 0.4 mg by mouth daily.   Vitamin D (Ergocalciferol) 50000 units Caps capsule Commonly known as:  DRISDOL Take 50,000  Units by mouth every 7 (seven) days.   warfarin 5 MG tablet Commonly known as:  COUMADIN Take 5 mg by mouth daily.       Allergies  Allergen Reactions  . Bee Venom Swelling    Has epi pen available  . Cortisone Other (See Comments)    "Hiccups and belching - have to be put to sleep to get rid of"  . Onion Other (See Comments)    Nausea, vomiting, diarrhea    Consultations:  Hematology oncology.    Procedures/Studies: US Scrotum  Result Date: 07/06/2017 CLINICAL DATA:  Acute groin pain for 4 days on the left EXAM: SCROTAL ULTRASOUND DOPPLER ULTRASOUND OF THE TESTICLES TECHNIQUE: Complete ultrasound examination of the testicles, epididymis, and other scrotal structures was performed. Color and spectral Doppler ultrasound  were also utilized to evaluate blood flow to the testicles. COMPARISON:  07/06/2017 CT FINDINGS: Right testicle Measurements: 4.0 x 2.1 x 3.1 cm. No mass or microlithiasis visualized. Left testicle Measurements: 3.8 x 1.8 x 2.5 cm. No mass or microlithiasis visualized. Right epididymis:  Incidental small epididymal cyst measures 6 mm Left epididymis: Small minimally complex septated cyst measures 1 cm Hydrocele:  Small simple appearing bilateral hydroceles Varicocele:  None visualized. Pulsed Doppler interrogation of both testes demonstrates normal low resistance arterial and venous waveforms bilaterally. IMPRESSION: No acute scrotal or testicular abnormality.  Negative for torsion. Incidental epididymal cysts. Small simple hydroceles Electronically Signed   By: Jerilynn Mages.  Shick M.D.   On: 07/06/2017 11:50   [place] Art/ven Flow Abd Pelv Doppler  Result Date: 07/06/2017 CLINICAL DATA:  Acute groin pain for 4 days on the left EXAM: SCROTAL ULTRASOUND DOPPLER ULTRASOUND OF THE TESTICLES TECHNIQUE: Complete ultrasound examination of the testicles, epididymis, and other scrotal structures was performed. Color and spectral Doppler ultrasound were also utilized to evaluate blood flow to  the testicles. COMPARISON:  07/06/2017 CT FINDINGS: Right testicle Measurements: 4.0 x 2.1 x 3.1 cm. No mass or microlithiasis visualized. Left testicle Measurements: 3.8 x 1.8 x 2.5 cm. No mass or microlithiasis visualized. Right epididymis:  Incidental small epididymal cyst measures 6 mm Left epididymis: Small minimally complex septated cyst measures 1 cm Hydrocele:  Small simple appearing bilateral hydroceles Varicocele:  None visualized. Pulsed Doppler interrogation of both testes demonstrates normal low resistance arterial and venous waveforms bilaterally. IMPRESSION: No acute scrotal or testicular abnormality.  Negative for torsion. Incidental epididymal cysts. Small simple hydroceles Electronically Signed   By: Jerilynn Mages.  Shick M.D.   On: 07/06/2017 11:50   Ct Renal Stone Study  Result Date: 07/06/2017 CLINICAL DATA:  Left-sided groin pain. EXAM: CT ABDOMEN AND PELVIS WITHOUT CONTRAST TECHNIQUE: Multidetector CT imaging of the abdomen and pelvis was performed following the standard protocol without IV contrast. COMPARISON:  02/07/2005 FINDINGS: Lower chest: Lung bases are clear. No effusions. Heart is normal size. Hepatobiliary: No focal hepatic abnormality. Gallbladder unremarkable. Pancreas: No focal abnormality or ductal dilatation. Spleen: No focal abnormality.  Normal size. Adrenals/Urinary Tract: No adrenal abnormality. No focal renal abnormality. No stones or hydronephrosis. Urinary bladder is unremarkable. Stomach/Bowel: Sigmoid and descending colonic diverticulosis. No active diverticulitis. Appendix is normal. Stomach and small bowel unremarkable. No obstruction. Vascular/Lymphatic: Diffuse aortic calcifications. Slight aneurysmal dilatation of the mid abdominal aorta, 3 cm maximally. No adenopathy. Reproductive: No visible focal abnormality. Other: No free fluid or free air. There is stranding noted in the left retroperitoneum and retroperitoneum between the left pelvic sidewall and the urinary  bladder, extending superiorly anterior to the psoas and iliacus muscles compatible with small left retroperitoneal hematoma. Musculoskeletal: No acute bony abnormality. IMPRESSION: Small, presumably spontaneous hematoma within the left retroperitoneum and extraperitoneal space. No renal or ureteral stones.  No hydronephrosis. Left colonic diverticulosis. Aortoiliac atherosclerosis. 3 cm mid abdominal aortic aneurysm. Recommend followup by ultrasound in 3 years. This recommendation follows ACR consensus guidelines: White Paper of the ACR Incidental Findings Committee II on Vascular Findings. Natasha Mead Coll Radiol 2013; [phone] Electronically Signed   By: Rolm Baptise M.D.   On: 07/06/2017 11:18       Subjective: I am going home today.    Discharge Exam: Vitals:   07/08/17 1439 07/08/17 2057  BP: (!) 145/74 (!) 136/57  Pulse: 85 85  Resp: 18 20  Temp: 98.3 F (36.8 C) 98.4 F (36.9 C)  Vitals:   07/08/17 0527 07/08/17 1439 07/08/17 2007 07/08/17 2057  BP: 139/69 (!) 145/74  (!) 136/57  Pulse: 82 85  85  Resp: 16 18  20   Temp: 98.1 F (36.7 C) 98.3 F (36.8 C)  98.4 F (36.9 C)  TempSrc: Oral Oral    SpO2: 97% 98% 94% 96%  Weight:      Height:        General: Pt is alert, awake, not in acute distress Cardiovascular: RRR, S1/S2 +, no rubs, no gallops Respiratory: CTA bilaterally, no wheezing, no rhonchi Abdominal: Soft, NT, ND, bowel sounds + Extremities: no edema, no cyanosis    The results of significant diagnostics from this hospitalization (including imaging, microbiology, ancillary and laboratory) are listed below for reference.     Microbiology: No results found for this or any previous visit (from the past 240 hour(s)).   Labs: BNP (last 3 results) No results for input(s): BNP in the last 8760 hours. Basic Metabolic Panel:  Recent Labs Lab 07/06/17 1032 07/07/17 0545 07/08/17 0536  NA 138 138 136  K 3.4* 3.9 3.9  CL 104 105 103  CO2 25 25 27   GLUCOSE  150* 104* 109*  BUN 16 17 15   CREATININE 1.50* 1.15 1.14  CALCIUM 8.9 8.5* 8.9   Liver Function Tests:  Recent Labs Lab 07/07/17 0545  AST 16  ALT 12*  ALKPHOS 33*  BILITOT 0.8  PROT 6.4*  ALBUMIN 3.5   CBC:  Recent Labs Lab 07/06/17 1032 07/06/17 1629 07/06/17 2301 07/07/17 0545 07/07/17 0914 07/08/17 0536  WBC 7.9 7.0 7.1 6.0 7.5 5.7  NEUTROABS 5.1  --   --   --   --   --   HGB 12.5* 12.2* 11.5* 11.3* 12.2* 11.6*  HCT 37.0* 35.8* 33.8* 33.3* 36.6* 34.0*  MCV 90.7 91.6 90.9 90.7 91.3 90.7  PLT 152 146* 154 149* 164 163   CBG:  Recent Labs Lab 07/08/17 1116 07/08/17 1606 07/08/17 2055 07/09/17 0736 07/09/17 1128  GLUCAP 182* 227* 188* 147* 192*   Urinalysis    Component Value Date/Time   COLORURINE YELLOW 07/06/2017 1710   APPEARANCEUR CLEAR 07/06/2017 1710   LABSPEC 1.026 07/06/2017 1710   PHURINE 6.0 07/06/2017 1710   GLUCOSEU >=500 (A) 07/06/2017 1710   HGBUR NEGATIVE 07/06/2017 1710   BILIRUBINUR NEGATIVE 07/06/2017 1710   KETONESUR NEGATIVE 07/06/2017 1710   PROTEINUR NEGATIVE 07/06/2017 1710   UROBILINOGEN 0.2 11/21/2007 2317   NITRITE NEGATIVE 07/06/2017 1710   LEUKOCYTESUR NEGATIVE 07/06/2017 1710    Time coordinating discharge: Over 30 minutes SIGNED:  Orvan Falconer, MD FACP [facility] 07/09/2017, 7:09 PM   If 7PM-7AM, please contact night-coverage [email or web address] Password [email or web address]

## 2017-07-10 LAB — CARDIOLIPIN ANTIBODIES, IGG, IGM, IGA: Anticardiolipin IgG: <9 GPL U/mL (ref 0–14)

## 2017-07-12 LAB — PROTHROMBIN GENE MUTATION

## 2017-07-15 LAB — FACTOR 5 LEIDEN

## 2018-03-02 IMAGING — CT CT RENAL STONE PROTOCOL
2 of 4 series · 16 of 46 positions shown, 18 images · non-contrast
Comparison: 02/07/2005

CLINICAL DATA: Left-sided groin pain.

EXAM:
CT ABDOMEN AND PELVIS WITHOUT CONTRAST
TECHNIQUE: Multidetector CT imaging of the abdomen and pelvis was performed
following the standard protocol without IV contrast.

[Series 2: axial st · axial · 0.94mm/px · z∈[+663,+1143]mm · 13 of 104 slices shown, 15 images]
[im 4/104  soft-tissue]
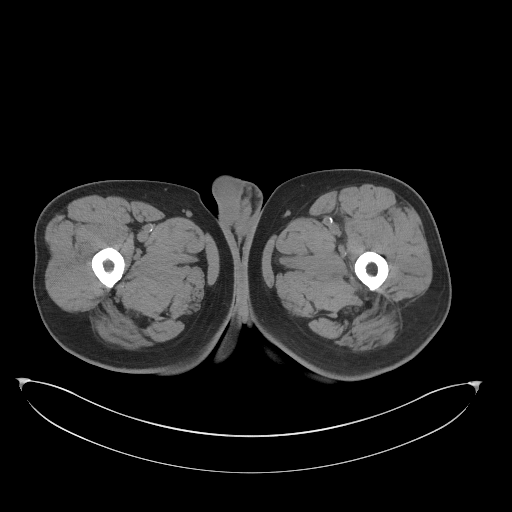
[im 4/104  bone]
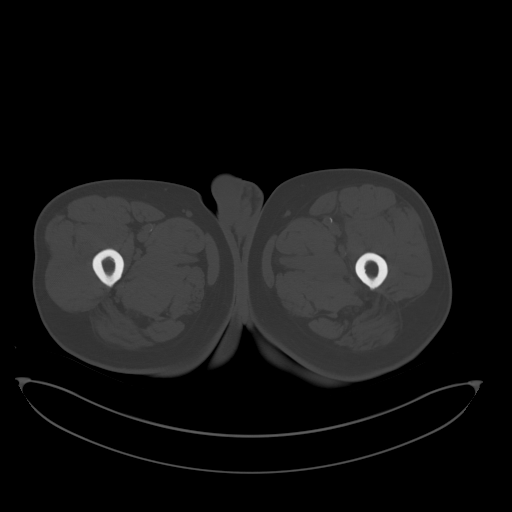
[im 12/104  soft-tissue]
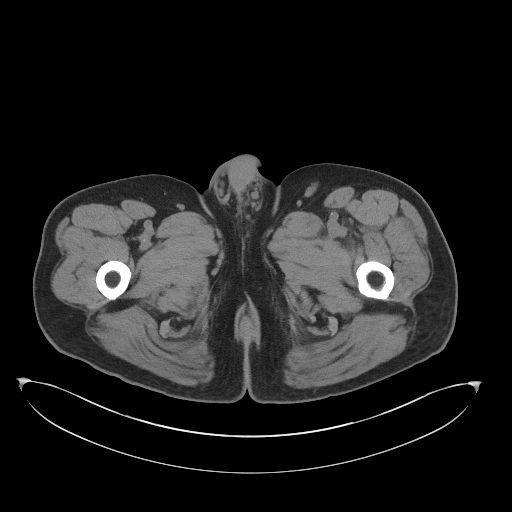
[im 20/104  soft-tissue]
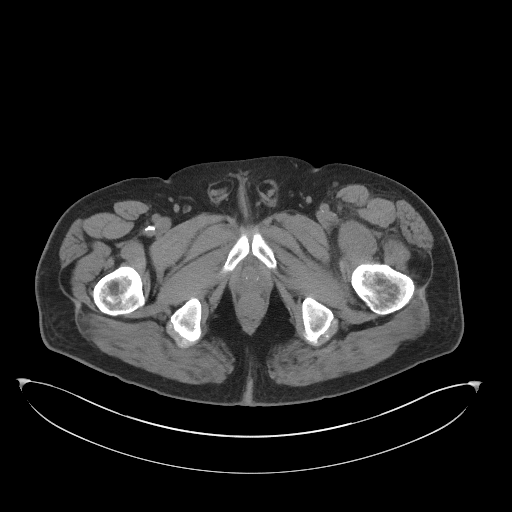
[im 28/104  soft-tissue]
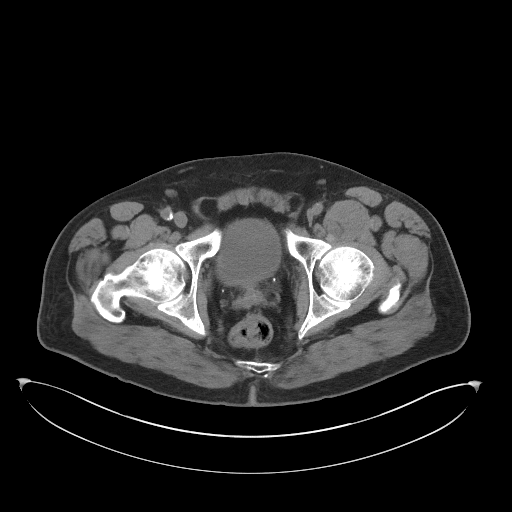
[im 36/104  soft-tissue]
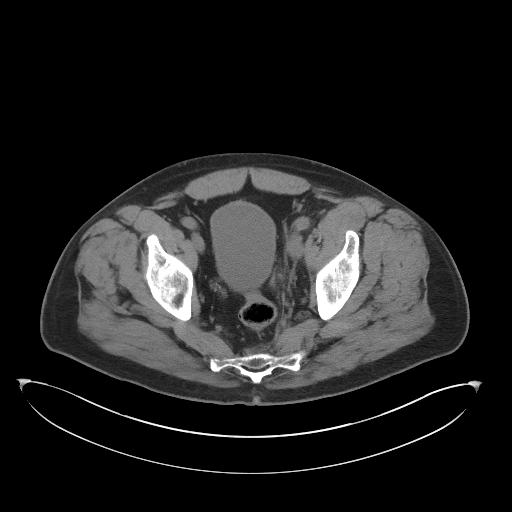
[im 44/104  soft-tissue]
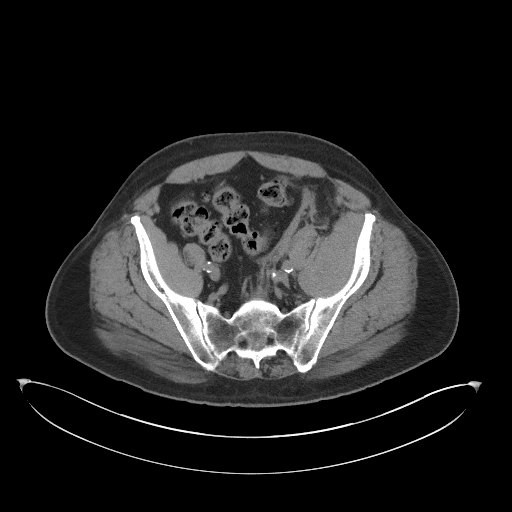
[im 52/104  soft-tissue]
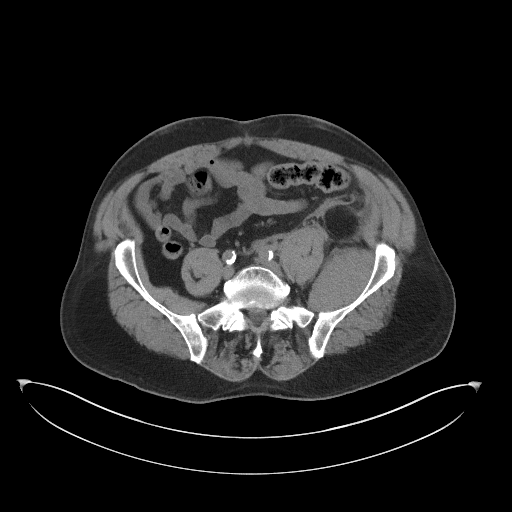
[im 60/104  soft-tissue]
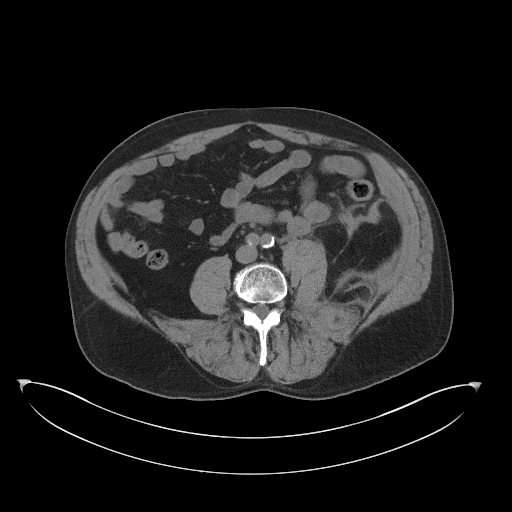
[im 68/104  soft-tissue]
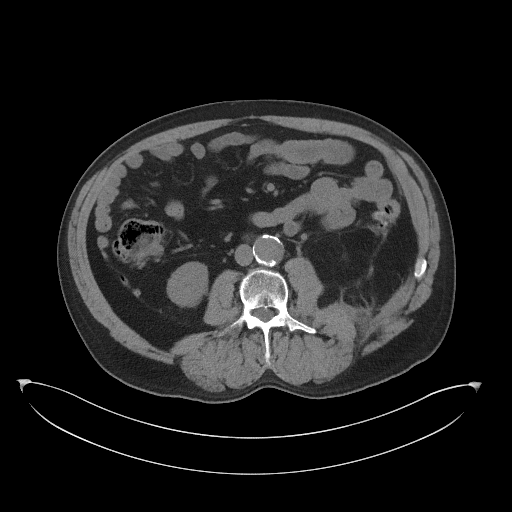
[im 68/104  bone]
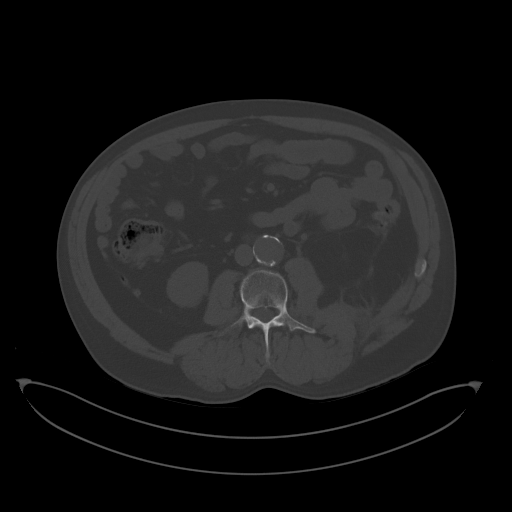
[im 76/104  soft-tissue]
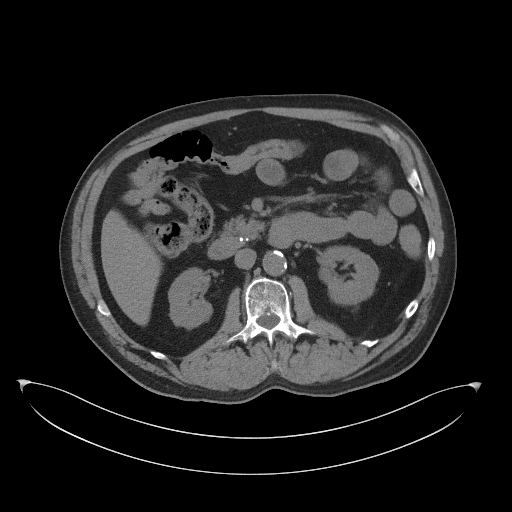
[im 84/104  soft-tissue]
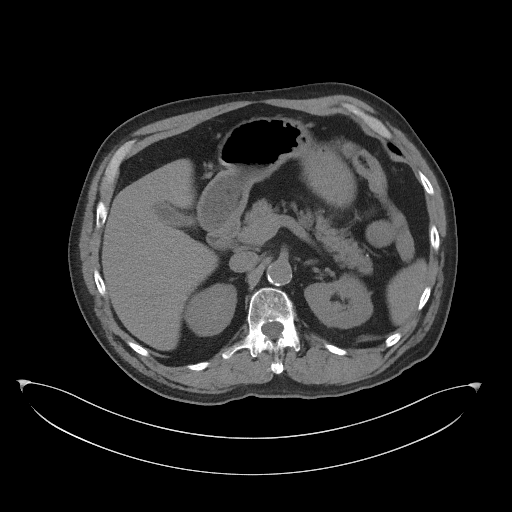
[im 92/104  soft-tissue]
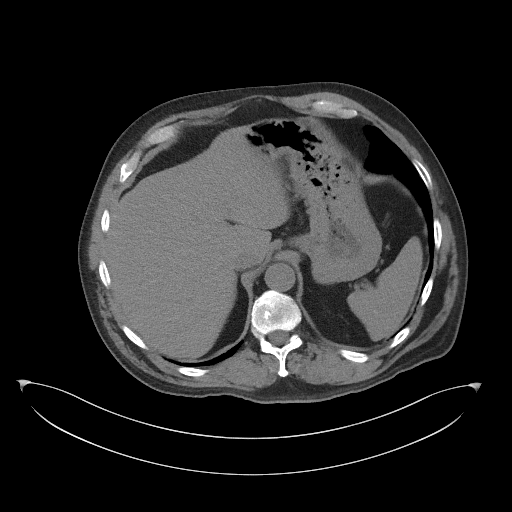
[im 100/104  soft-tissue]
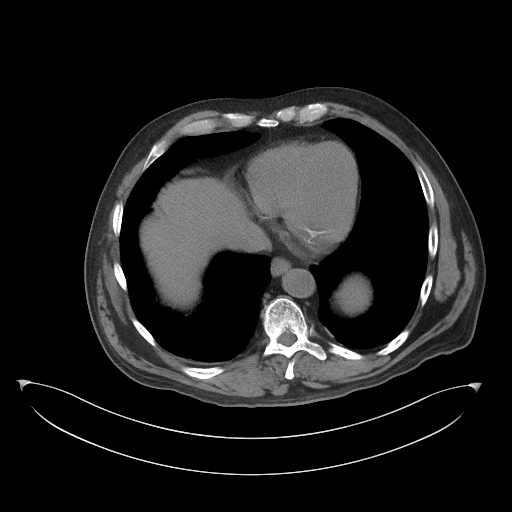

[Series 5: coronal st · coronal · 0.87mm/px · 3 of 106 slices shown]
[im 36/106  soft-tissue]
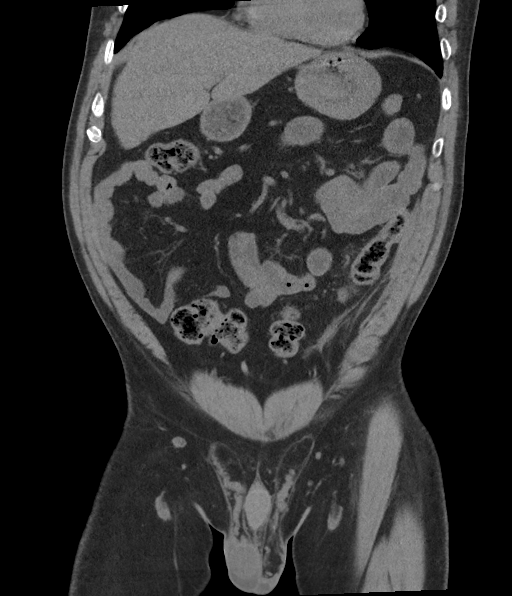
[im 47/106  soft-tissue]
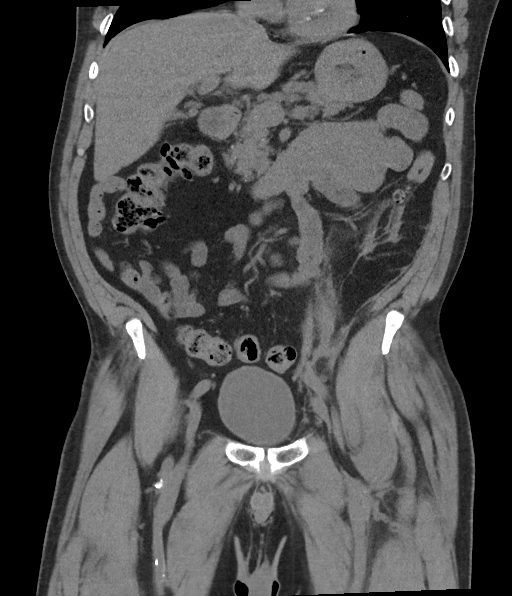
[im 59/106  soft-tissue]
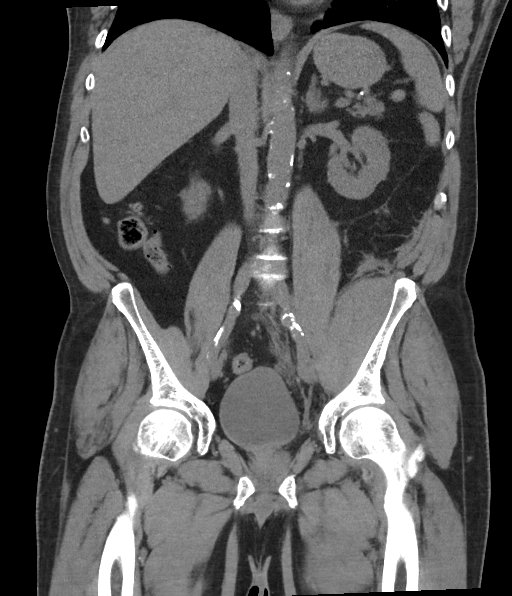

[16 of 46 positions shown; findings below may reference images not displayed]

FINDINGS: Lower chest: Lung bases are clear. No effusions. Heart is normal
size.

Hepatobiliary: No focal hepatic abnormality. Gallbladder
unremarkable.

Pancreas: No focal abnormality or ductal dilatation.

Spleen: No focal abnormality.  Normal size.

Adrenals/Urinary Tract: No adrenal abnormality. No focal renal
abnormality. No stones or hydronephrosis. Urinary bladder is
unremarkable.

Stomach/Bowel: Sigmoid and descending colonic diverticulosis. No
active diverticulitis. Appendix is normal. Stomach and small bowel
unremarkable. No obstruction.

Vascular/Lymphatic: Diffuse aortic calcifications. Slight aneurysmal
dilatation of the mid abdominal aorta, 3 cm maximally. No
adenopathy.

Reproductive: No visible focal abnormality.

Other: No free fluid or free air. There is stranding noted in the
left retroperitoneum and retroperitoneum between the left pelvic
sidewall and the urinary bladder, extending superiorly anterior to
the psoas and iliacus muscles compatible with small left
retroperitoneal hematoma.

Musculoskeletal: No acute bony abnormality.
IMPRESSION: Small, presumably spontaneous hematoma within the left
retroperitoneum and extraperitoneal space.

No renal or ureteral stones.  No hydronephrosis.

Left colonic diverticulosis.

Aortoiliac atherosclerosis. 3 cm mid abdominal aortic aneurysm.
Recommend followup by ultrasound in 3 years. This recommendation
follows ACR consensus guidelines: White Paper of the ACR Incidental

## 2018-07-06 IMAGING — US US ART/VEN ABD/PELV/SCROTUM DOPPLER LTD
1 series · 14 of 25 positions shown · non-contrast
Comparison: 07/06/2017 CT

CLINICAL DATA: Acute groin pain for 4 days on the left

EXAM:
SCROTAL ULTRASOUND
DOPPLER ULTRASOUND OF THE TESTICLES
TECHNIQUE: Complete ultrasound examination of the testicles, epididymis, and
other scrotal structures was performed. Color and spectral Doppler
ultrasound were also utilized to evaluate blood flow to the
testicles.

[Series 1: us art/ven abd/pelv/scrotum doppler ltd · 0.07mm/px · 14 of 76 slices shown]
[im 1/76]
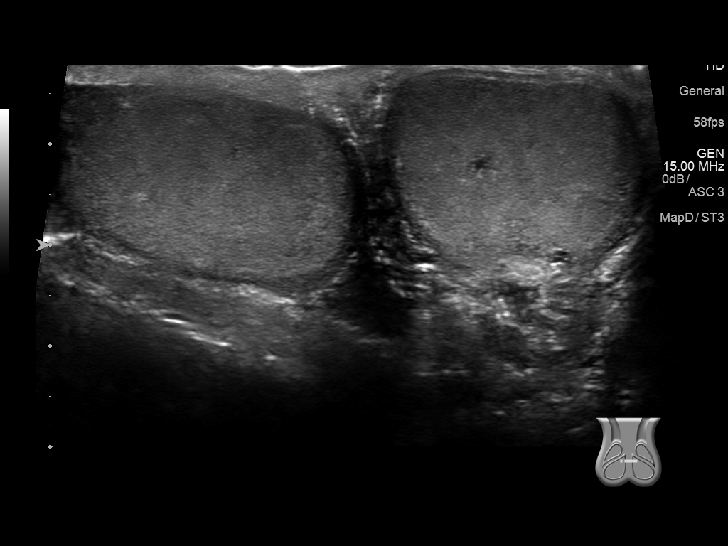
[im 7/76]
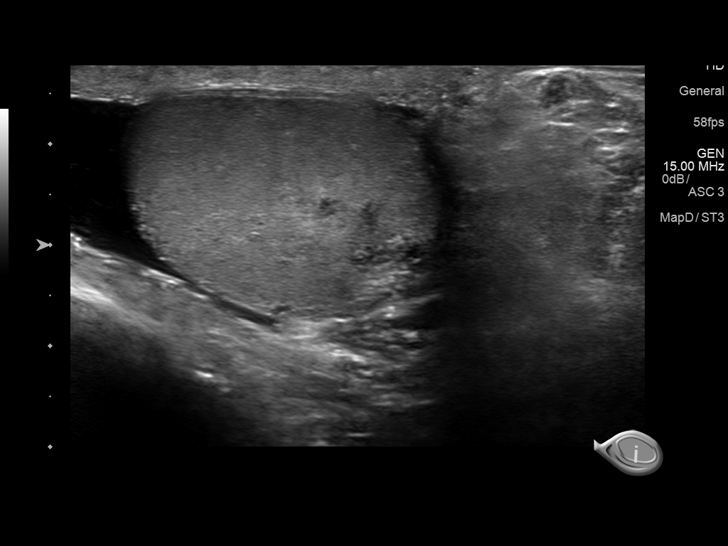
[im 13/76]
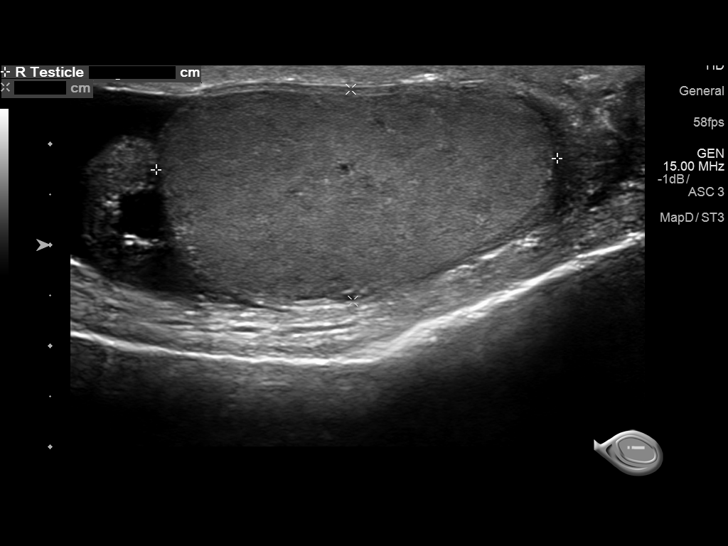
[im 19/76]
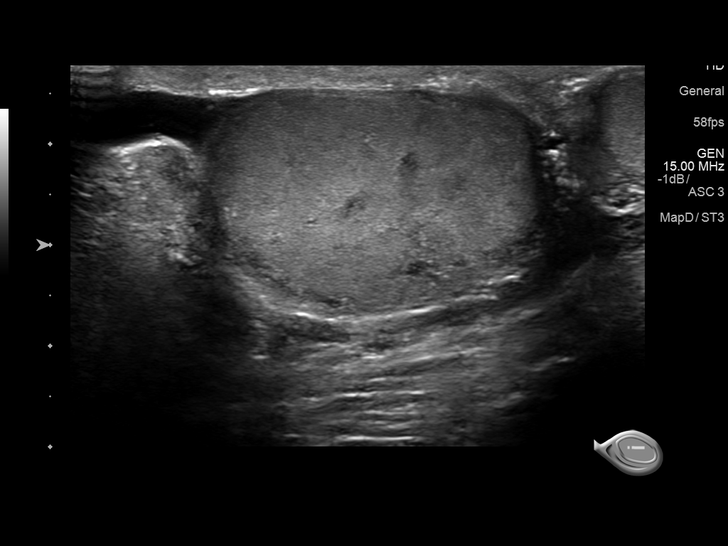
[im 26/76]
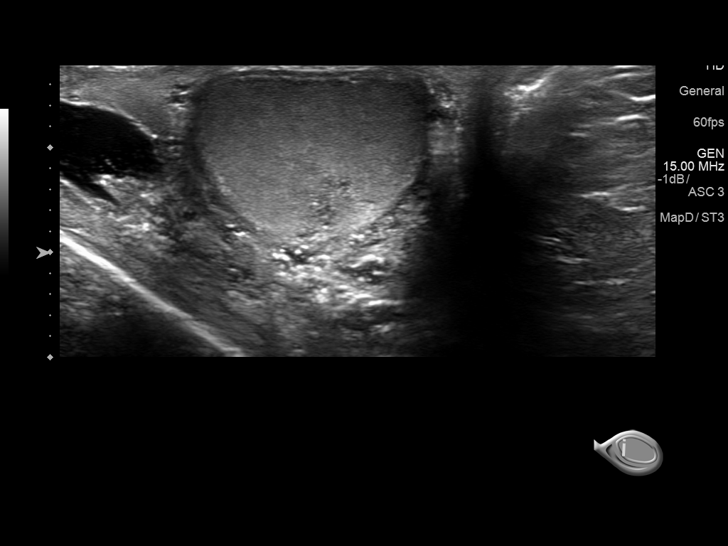
[im 29/76]
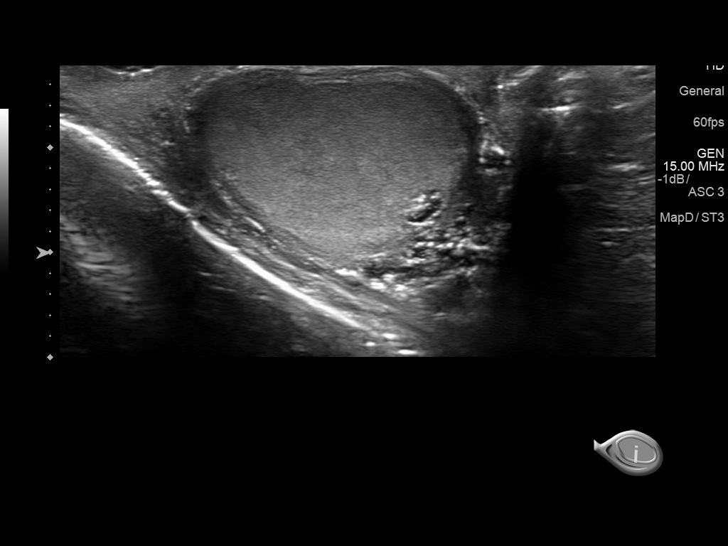
[im 35/76]
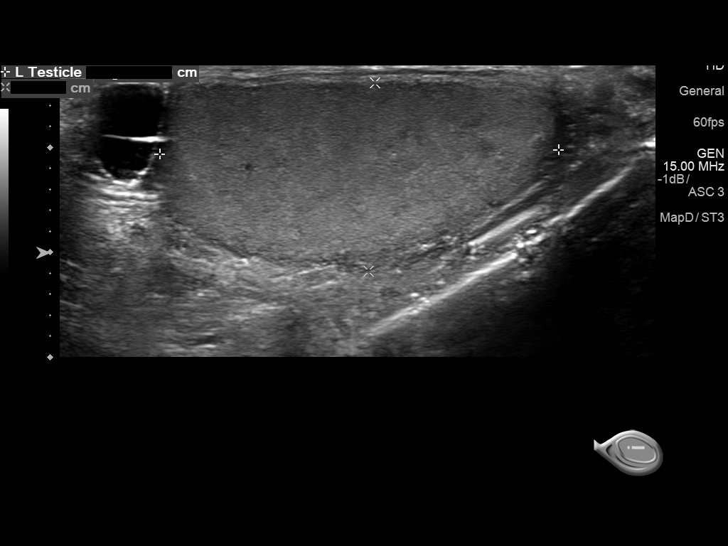
[im 41/76]
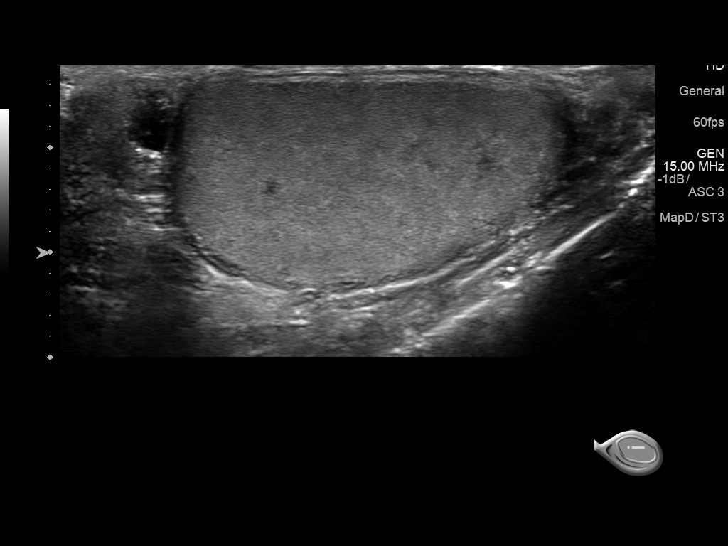
[im 47/76]
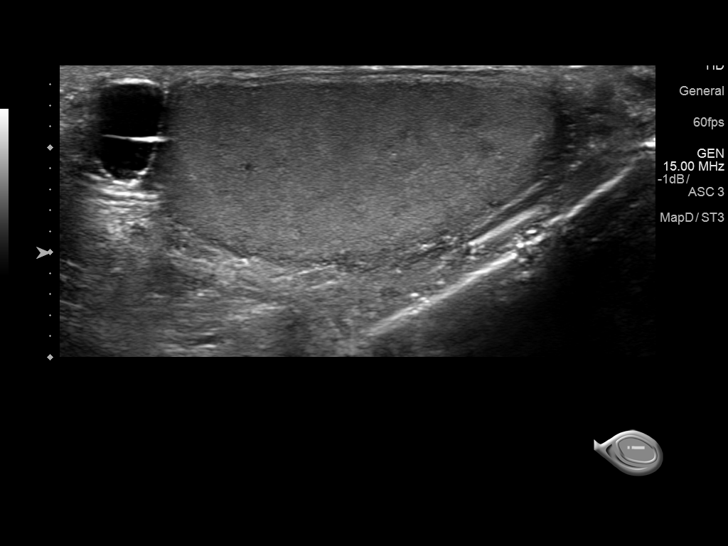
[im 51/76]
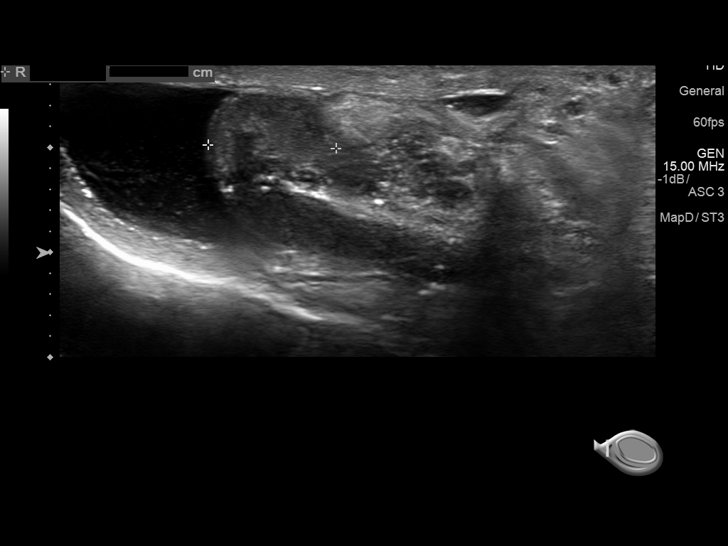
[im 57/76]
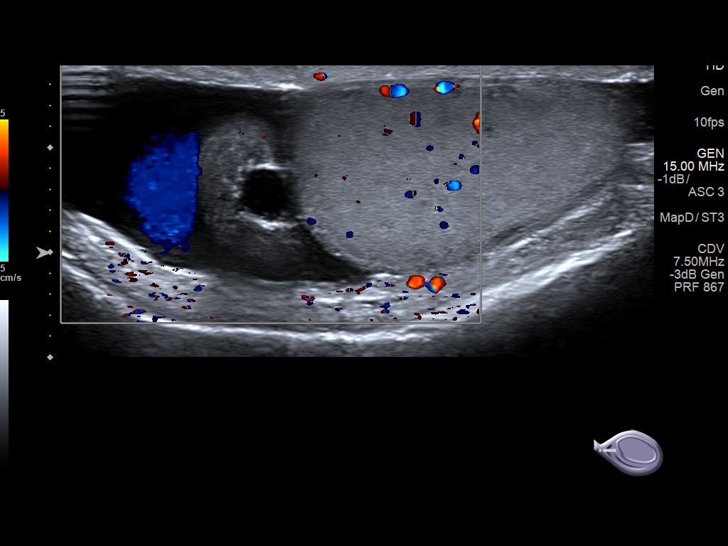
[im 63/76]
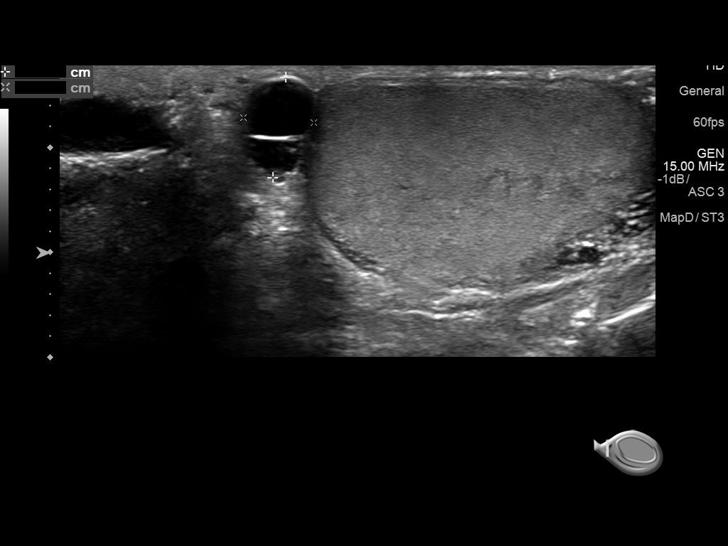
[im 69/76]
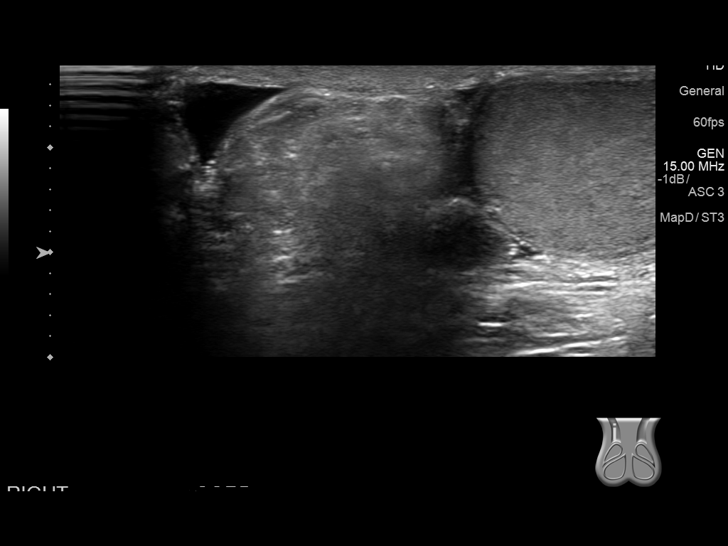
[im 76/76]
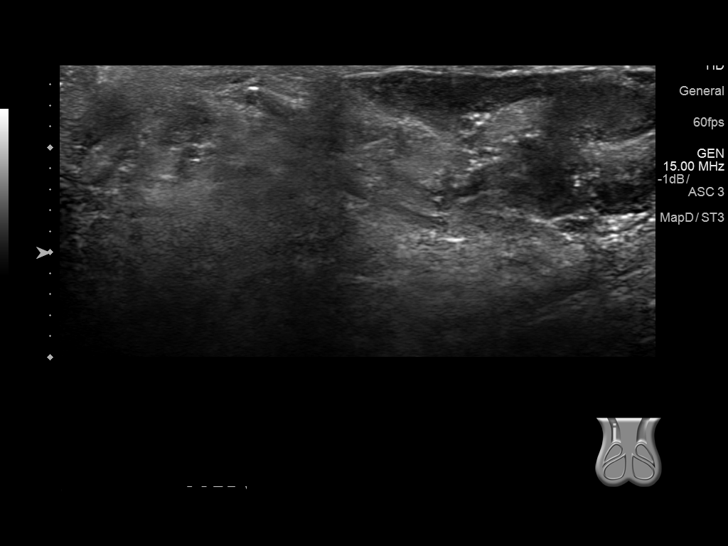

[14 of 25 positions shown; findings below may reference images not displayed]

FINDINGS: Right testicle

Measurements: 4.0 x 2.1 x 3.1 cm. No mass or microlithiasis
visualized.

Left testicle

Measurements: 3.8 x 1.8 x 2.5 cm. No mass or microlithiasis
visualized.

Right epididymis:  Incidental small epididymal cyst measures 6 mm

Left epididymis: Small minimally complex septated cyst measures 1 cm

Hydrocele:  Small simple appearing bilateral hydroceles

Varicocele:  None visualized.

Pulsed Doppler interrogation of both testes demonstrates normal low
resistance arterial and venous waveforms bilaterally.
IMPRESSION: No acute scrotal or testicular abnormality.  Negative for torsion.

Incidental epididymal cysts.

Small simple hydroceles

## 2018-12-23 ENCOUNTER — Telehealth: Payer: Self-pay | Admitting: Gastroenterology

## 2018-12-23 NOTE — Telephone Encounter (Signed)
Hi Dr. Lyndel Safe, we have received a referral from pt's PCP on 12/15/18 AM requesting pt being seen for a repeat colon. Pt;s last colon was 10+ years ago. Pt saw a Gi doctor at [facility] this past December but is looking to transfer care to [place]. Records from [facility] are in [place]. Could you please review records and advise if it is ok to schedule patient with you? Thank you.

## 2018-12-23 NOTE — Telephone Encounter (Signed)
I have briefly reviewed the records -Had EGD with dilatation at [place] [facility] like had colonoscopies performed at [facility] -Currently on blood thinners (Coumadin) and has multiple other medical problems  Plan: -Should come in for office visit -Please get report regarding last colonoscopy from [place] if possible -Bring all the medications to [facility]

## 2018-12-26 NOTE — Telephone Encounter (Signed)
Left message to call back to schedule consult. Also, received last colon report and placed in referral folder.

## 2019-01-16 ENCOUNTER — Encounter: Payer: Self-pay | Admitting: Gastroenterology

## 2019-01-16 ENCOUNTER — Telehealth: Payer: Self-pay | Admitting: Gastroenterology

## 2019-01-16 NOTE — Telephone Encounter (Signed)
Hi Dr. Tarri Glenn, we have received a referral from pt's PCP on 12/15/18 AM requesting pt being seen for a repeat colon. Pt's last colon was 10+ years ago. Pt saw a Gi doctor at [place] this past December but is looking to transfer care to [place]. Records from [place] are in [place] and records from the [place] will be placed on your desk. Dr. Lyndel Safe reviewed pt's records originally but patient declined to go to [facility] and prefers to come to [place]. Could you please review his records and advise if it is ok to schedule patient with you? Thank you.

## 2019-01-16 NOTE — Telephone Encounter (Signed)
Pt returned my call, he declined to go to [facility] for office visit as he lives in [place] and would prefer an md based in this office. Records will be sent to Dr. Tarri Glenn (DOD 01/16/19 AM) for review.

## 2019-01-16 NOTE — Telephone Encounter (Signed)
Consult scheduled on 2/26 at 2:00pm

## 2019-01-16 NOTE — Telephone Encounter (Signed)
I have briefly reviewed the records -Had EGD with dilatation at [facility] in 2019 -Looks like had colonoscopies performed at [facility] -Currently on blood thinners (Coumadin) and has multiple other medical problems  Plan: -Should come in for office visit -Please get reports from colonoscopy procedures and path from [place] before his office visit -Bring all the medications to [facility]

## 2019-02-04 ENCOUNTER — Encounter: Payer: Self-pay | Admitting: Gastroenterology

## 2019-02-04 ENCOUNTER — Ambulatory Visit (INDEPENDENT_AMBULATORY_CARE_PROVIDER_SITE_OTHER): Payer: Medicare HMO | Admitting: Gastroenterology

## 2019-02-04 ENCOUNTER — Encounter (INDEPENDENT_AMBULATORY_CARE_PROVIDER_SITE_OTHER): Payer: Self-pay

## 2019-02-04 ENCOUNTER — Telehealth: Payer: Self-pay

## 2019-02-04 VITALS — BP 140/80 | HR 86 | Ht 70.0 in | Wt 217.0 lb

## 2019-02-04 DIAGNOSIS — Z7901 Long term (current) use of anticoagulants: Secondary | ICD-10-CM | POA: Diagnosis not present

## 2019-02-04 DIAGNOSIS — Z1211 Encounter for screening for malignant neoplasm of colon: Secondary | ICD-10-CM

## 2019-02-04 NOTE — Telephone Encounter (Signed)
Dr. Melina Copa - [facility]     Request for surgical clearance:     Endoscopy Procedure  What type of surgery is being performed?    Colonoscopy  When is this surgery scheduled?     02/10/2019  What type of clearance is required ?   Pharmacy  Are there any medications that need to be held prior to surgery and how long? Warfarin x 4 days  Practice name and name of physician performing surgery?      [facility]  What is your office phone and fax number?      Phone- [phone]  Fax- [phone] Attn: Helmut Muster   Anesthesia type (None, local, MAC, general) ?       MAC

## 2019-02-04 NOTE — Patient Instructions (Addendum)
If you are age 74 or older, your body mass index should be between 23-30. Your Body mass index is 31.14 kg/m. If this is out of the aforementioned range listed, please consider follow up with your Primary Care Provider.  You have been scheduled for a colonoscopy. Please follow written instructions given to you at your visit today.  Please pick up your prep supplies at the pharmacy within the next 1-3 days. If you use inhalers (even only as needed), please bring them with you on the day of your procedure. Your physician has requested that you go to www.startemmi.com and enter the access code given to you at your visit today. This web site gives a general overview about your procedure. However, you should still follow specific instructions given to you by our office regarding your preparation for the procedure.  We will contact Dr. Melina Copa office regarding holding your warfarin for 4 days prior to procedure.   We provided instructions for [place] as this  is the prep that you stated was given to you by the [place].   Thank you for choosing me and [facility].   Dr. Tarri Glenn

## 2019-02-04 NOTE — Progress Notes (Signed)
Referring Provider: No ref. provider found Primary Care Physician:  System, Pcp Not In   Reason for Consultation:  Need for colon cancer screening   IMPRESSION:  Need for colon cancer screening      - normal screening colonoscopy 2009 History of dysphasia with normal EGD 08/15/2018 at [facility] in [place]    - Empiric dilation performed with a 35 French Maloney dilator    - No esophageal biopsies were obtained On chronic warfarin for coronary artery stents  Colonoscopy off warfarin recommended. Ideally, would hold warfarin for 4 days before endoscopy.  I discussed with the patient that there is a low, but real, risk of a cardiovascular event such as heart attack, stroke, or embolism/thrombosis while off warfarin.  The patient verbalizes understanding these risks and to proceed.  Will communicate by phone or EMR with patient's prescribing provider to confirm that holding the Plavix is appropriate at this time.   PLAN: Colonoscopy after a warfarin washout (Dr. Melina Copa is the prescribing doctor, [phone]))  I consented the patient at the bedside today discussing the risks, benefits, and alternatives to endoscopic evaluation. In particular, we discussed the risks that include, but are not limited to, reaction to medication, cardiopulmonary compromise, bleeding requiring blood transfusion, aspiration resulting in pneumonia, perforation requiring surgery, lack of diagnosis, severe illness requiring hospitalization, and even death. We reviewed the risk of missed lesion including polyps or even cancer. The patient acknowledges these risks and asks that we proceed.   HPI: Rodney Moyer. is a 73 y.o. male seen in consultation at the request of the [place].  The history is obtained to the patient and review of his electronic health record as well as his referral records from the [place].  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 Hemorrhoidectomy 11 years ago. Records also show that he has been on Plavix, although he is no longer on Plavix.  He had a normal colonoscopy in 2009. No ongoing GI symptoms.   He had an EGD with dilation for dysphagia at [facility] 08/15/18.  Reviewed these results in [place].  The endoscopy report notes a grossly normal-appearing esophagus.  Esophageal dilation with a 56 French Maloney dilator was performed given the patient complaints of solid food dysphasia.  Mild antral erythema was noted.  Gastric biopsies were obtained and were negative for H. pylori.  He denies dysphasia at this time.  No known family history of colon cancer or polyps.   Past Medical History:  Diagnosis Date  . Anemia   . Anxiety   . Benign brain tumor ([place])   . Benign prostate hyperplasia   . COPD (chronic obstructive pulmonary disease) ([place])   . Coronary artery disease   . Depression   . Diabetes mellitus without complication ([place])    Type II  . Head injury    'years ago'  . Hypertension   . Pneumonia   . Pulmonary embolism [facility])     Past Surgical History:  Procedure Laterality Date  . BACK SURGERY     cervical and lumbar x 3 surgeries  . CARDIAC CATHETERIZATION  2015  . CATARACT EXTRACTION Bilateral 06/13/2017  . HERNIA REPAIR     umbilical  . ROTATOR CUFF REPAIR Bilateral 12/2014   twice on the left and once on  the right    Current Outpatient Medications  Medication Sig Dispense Refill  . aspirin 81 MG tablet Take 81 mg by mouth daily.    . clonazePAM (KLONOPIN) 0.5 MG tablet Take 0.5 mg by mouth 2 (two) times daily as needed for anxiety.    . cyanocobalamin 1000 MCG tablet Take 100 mcg by mouth daily.    . divalproex (DEPAKOTE) 500 MG DR tablet Take 1,500 mg by mouth at bedtime.    . finasteride (PROSCAR) 5 MG tablet Take 5 mg by mouth daily.    Marland Kitchen  HYDROcodone-acetaminophen (NORCO/VICODIN) 5-325 MG per tablet Take 1 tablet by mouth every 6 (six) hours as needed for moderate pain.    Marland Kitchen insulin aspart protamine- aspart (NOVOLOG MIX 70/30) (70-30) 100 UNIT/ML injection Inject 25 Units into the skin 2 (two) times daily with a meal.    . omeprazole (PRILOSEC) 20 MG capsule Take 20 mg by mouth daily.    . tamsulosin (FLOMAX) 0.4 MG CAPS capsule Take 0.4 mg by mouth daily.    . Vitamin D, Ergocalciferol, (DRISDOL) 50000 units CAPS capsule Take 50,000 Units by mouth every 7 (seven) days.    Marland Kitchen warfarin (COUMADIN) 5 MG tablet Take 5 mg by mouth daily.     No current facility-administered medications for this visit.     Allergies as of 02/04/2019 - Review Complete 02/04/2019  Allergen Reaction Noted  . Bee venom Swelling 04/19/2015  . Cortisone Other (See Comments) 04/19/2015  . Onion Other (See Comments) 04/19/2015    Family History  Problem Relation Age of Onset  . Colon cancer Neg Hx   . Esophageal cancer Neg Hx   . Rectal cancer Neg Hx     Social History   Socioeconomic History  . Marital status: Divorced    Spouse name: Not on file  . Number of children: 3  . Years of education: Not on file  . Highest education level: Not on file  Occupational History  . Occupation: retired  Scientific laboratory technician  . Financial resource strain: Not on file  . Food insecurity:    Worry: Not on file    Inability: Not on file  . Transportation needs:    Medical: Not on file    Non-medical: Not on file  Tobacco Use  . Smoking status: Current Every Day Smoker    Packs/day: 0.75    Years: 31.00    Pack years: 23.25    Types: Cigarettes  . Smokeless tobacco: Never Used  Substance and Sexual Activity  . Alcohol use: No  . Drug use: No  . Sexual activity: Not Currently  Lifestyle  . Physical activity:    Days per week: Not on file    Minutes per session: Not on file  . Stress: Not on file  Relationships  . Social connections:    Talks on  phone: Not on file    Gets together: Not on file    Attends religious service: Not on file    Active member of club or organization: Not on file    Attends meetings of clubs or organizations: Not on file    Relationship status: Not on file  . Intimate partner violence:    Fear of current or ex partner: Not on file    Emotionally abused: Not on file    Physically abused: Not on file    Forced sexual activity: Not on file  Other Topics Concern  . Not on file  Social History Narrative  .  Not on file    Review of Systems: 12 system ROS is negative except as noted above with the additions of back pain, headaches, muscle pains, shortness of breath, and a heart murmur.  Filed Weights   02/04/19 1401  Weight: 217 lb (98.4 kg)    Physical Exam: Vital signs were reviewed. General:   Alert, well-nourished, pleasant and cooperative in NAD Head:  Normocephalic and atraumatic. Eyes:  Sclera clear, no icterus.   Conjunctiva pink. Mouth:  No deformity or lesions.   Neck:  Supple; no thyromegaly. Lungs:  Clear throughout to auscultation.   No wheezes.  Heart:  Regular rate and rhythm; no murmurs Abdomen:  Soft, nontender, normal bowel sounds. No rebound or guarding. No hepatosplenomegaly Rectal:  Deferred  Msk:  Symmetrical without gross deformities. Extremities:  No gross deformities or edema. Neurologic:  Alert and  oriented x4;  grossly nonfocal Skin:  No rash or bruise. Psych:  Alert and cooperative. Normal mood and affect.   Sharla Tankard L. Tarri Glenn, MD, MPH [facility] 02/04/2019, 2:30 PM

## 2019-02-05 ENCOUNTER — Encounter: Payer: Self-pay | Admitting: Gastroenterology

## 2019-02-05 NOTE — Telephone Encounter (Signed)
Recd fax back from Dr. Carmie End office, stating that patient call hold warfarin starting today 02/05/2019. Pt was informed of this by Dr. Carmie End office as well as our office.

## 2019-02-10 ENCOUNTER — Other Ambulatory Visit: Payer: Self-pay

## 2019-02-10 ENCOUNTER — Ambulatory Visit (AMBULATORY_SURGERY_CENTER): Payer: Medicare HMO | Admitting: Gastroenterology

## 2019-02-10 ENCOUNTER — Encounter: Payer: Self-pay | Admitting: Gastroenterology

## 2019-02-10 VITALS — BP 145/84 | HR 73 | Temp 98.6°F | Resp 13 | Ht 70.0 in | Wt 217.0 lb

## 2019-02-10 DIAGNOSIS — D125 Benign neoplasm of sigmoid colon: Secondary | ICD-10-CM

## 2019-02-10 DIAGNOSIS — D124 Benign neoplasm of descending colon: Secondary | ICD-10-CM | POA: Diagnosis not present

## 2019-02-10 DIAGNOSIS — D128 Benign neoplasm of rectum: Secondary | ICD-10-CM

## 2019-02-10 DIAGNOSIS — K621 Rectal polyp: Secondary | ICD-10-CM

## 2019-02-10 DIAGNOSIS — Z1211 Encounter for screening for malignant neoplasm of colon: Secondary | ICD-10-CM | POA: Diagnosis not present

## 2019-02-10 DIAGNOSIS — D120 Benign neoplasm of cecum: Secondary | ICD-10-CM | POA: Diagnosis not present

## 2019-02-10 DIAGNOSIS — D122 Benign neoplasm of ascending colon: Secondary | ICD-10-CM

## 2019-02-10 DIAGNOSIS — D129 Benign neoplasm of anus and anal canal: Secondary | ICD-10-CM

## 2019-02-10 MED ORDER — SODIUM CHLORIDE 0.9 % IV SOLN: 500.0000 mL | Freq: Once | INTRAVENOUS | Status: DC

## 2019-02-10 NOTE — Progress Notes (Signed)
Pt's states no medical or surgical changes since previsit or office visit.  Patient stated that we could not use anything but his hand for an IV.  States that he will "comeback at [place] if we use anything else."   Patient did not want to tell me when he had his medications last.  Did tell us about coumadin.

## 2019-02-10 NOTE — Patient Instructions (Signed)
Handouts given for polyps, diverticulosis and high fiber diet.  You may resume your Coumadin in 2 days.  Await pathology results to determine when to repeat your colonoscopy.  YOU HAD AN ENDOSCOPIC PROCEDURE TODAY AT [facility]:   Refer to the procedure report that was given to you for any specific questions about what was found during the examination.  If the procedure report does not answer your questions, please call your gastroenterologist to clarify.  If you requested that your care partner not be given the details of your procedure findings, then the procedure report has been included in a sealed envelope for you to review at your convenience later.  YOU SHOULD EXPECT: Some feelings of bloating in the abdomen. Passage of more gas than usual.  Walking can help get rid of the air that was put into your GI tract during the procedure and reduce the bloating. If you had a lower endoscopy (such as a colonoscopy or flexible sigmoidoscopy) you may notice spotting of blood in your stool or on the toilet paper. If you underwent a bowel prep for your procedure, you may not have a normal bowel movement for a few days.  Please Note:  You might notice some irritation and congestion in your nose or some drainage.  This is from the oxygen used during your procedure.  There is no need for concern and it should clear up in a day or so.  SYMPTOMS TO REPORT IMMEDIATELY:   Following lower endoscopy (colonoscopy or flexible sigmoidoscopy):  Excessive amounts of blood in the stool  Significant tenderness or worsening of abdominal pains  Swelling of the abdomen that is new, acute  Fever of 100F or higher  Black, tarry-looking stools  For urgent or emergent issues, a gastroenterologist can be reached at any hour by calling [phone].   DIET:  We do recommend a small meal at first, but then you may proceed to your regular diet.  Drink plenty of fluids but you should avoid alcoholic  beverages for 24 hours.  ACTIVITY:  You should plan to take it easy for the rest of today and you should NOT DRIVE or use heavy machinery until tomorrow (because of the sedation medicines used during the test).    FOLLOW UP: Our staff will call the number listed on your records the next business day following your procedure to check on you and address any questions or concerns that you may have regarding the information given to you following your procedure. If we do not reach you, we will leave a message.  However, if you are feeling well and you are not experiencing any problems, there is no need to return our call.  We will assume that you have returned to your regular daily activities without incident.  If any biopsies were taken you will be contacted by phone or by letter within the next 1-3 weeks.  Please call us at [phone] if you have not heard about the biopsies in 3 weeks.    SIGNATURES/CONFIDENTIALITY: You and/or your care partner have signed paperwork which will be entered into your electronic medical record.  These signatures attest to the fact that that the information above on your After Visit Summary has been reviewed and is understood.  Full responsibility of the confidentiality of this discharge information lies with you and/or your care-partner.

## 2019-02-10 NOTE — Progress Notes (Signed)
Report given to PACU, vss 

## 2019-02-10 NOTE — Progress Notes (Signed)
Called to room to assist during endoscopic procedure.  Patient ID and intended procedure confirmed with present staff. Received instructions for my participation in the procedure from the performing physician.  

## 2019-02-11 ENCOUNTER — Telehealth: Payer: Self-pay | Admitting: *Deleted

## 2019-02-11 NOTE — Telephone Encounter (Signed)
  Follow up Call-  Call back number 02/10/2019  Post procedure Call Back phone  # [phone]  Permission to leave phone message Yes  Some recent data might be hidden     Patient questions:  Do you have a fever, pain , or abdominal swelling? No. Pain Score  0 *  Have you tolerated food without any problems? Yes.    Have you been able to return to your normal activities? Yes.    Do you have any questions about your discharge instructions: Diet   No. Medications  No. Follow up visit  No.  Do you have questions or concerns about your Care? No.  Actions: * If pain score is 4 or above: No action needed, pain <4.

## 2019-02-11 NOTE — Op Note (Signed)
[place] Patient Name: Rodney Moyer Procedure Date: 02/10/2019 7:51 AM MRN: [id number] Endoscopist: Thornton Park MD, MD Age: 73 Referring MD:  Date of Birth: [birth year] Gender: Male Account #: [id number] Procedure:                Colonoscopy Indications:              Screening for colorectal malignant neoplasm. Normal                            colonoscopy 2009 at the [place]. No known family history                            of colon cancer or polyps. No baseline GI symptoms. Medicines:                See the Anesthesia note for documentation of the                            administered medications Procedure:                Pre-Anesthesia Assessment:                           - Prior to the procedure, a History and Physical                            was performed, and patient medications and                            allergies were reviewed. The patient's tolerance of                            previous anesthesia was also reviewed. The risks                            and benefits of the procedure and the sedation                            options and risks were discussed with the patient.                            All questions were answered, and informed consent                            was obtained. Prior Anticoagulants: The patient has                            taken Coumadin (warfarin), last dose was 5 days                            prior to procedure. ASA Grade Assessment: III - A                            patient with severe systemic disease. After  reviewing the risks and benefits, the patient was                            deemed in satisfactory condition to undergo the                            procedure.                           After obtaining informed consent, the colonoscope                            was passed under direct vision. Throughout the                            procedure, the patient's blood pressure, pulse,  and                            oxygen saturations were monitored continuously. The                            Colonoscope was introduced through the anus and                            advanced to the the terminal ileum, with                            identification of the appendiceal orifice and IC                            valve. The colonoscopy was performed with                            difficulty due to poor bowel prep. Successful                            completion of the procedure was aided by applying                            abdominal pressure. There was considerable colon                            spasm that ultimately improved with Robinol. The                            patient tolerated the procedure well. The quality                            of the bowel preparation was poor. I used extensive                            lavage to try to evaluate the colon to the best of  my ability. The terminal ileum, ileocecal valve,                            appendiceal orifice, and rectum were photographed. Scope In: 8:04:39 AM Scope Out: 8:51:58 AM Scope Withdrawal Time: 0 hours 42 minutes 52 seconds  Total Procedure Duration: 0 hours 47 minutes 19 seconds  Findings:                 The perianal and digital rectal examinations were                            normal.                           Multiple small and large-mouthed diverticula were                            found in the sigmoid colon, descending colon,                            ascending colon and cecum.                           A 2 mm polyp was found in the ascending colon. The                            polyp was sessile. The polyp was removed with a                            cold snare. Resection and retrieval were complete.                           Four multi-lobulated polyps were found on the fold                            of the ileocecal valve. The polyps abutted two                             diverticulum. The polyps were 3 to 8 mm in size.                            Area was successfully injected with 2 mL saline for                            a lift polypectomy. These polyps were removed with                            a piecemeal technique using a cold snare. Resection                            and retrieval were complete. Estimated blood loss                            was minimal.  Seven sessile polyps were found in the sigmoid                            colon and descending colon. The polyps were 2 to 8                            mm in size. These polyps were removed with a cold                            snare. Resection and retrieval were complete.                            Estimated blood loss was minimal.                           Two sessile polyps were found in the rectum. The                            polyps were 2 to 3 mm in size. These polyps were                            removed with a cold biopsy forceps after multiple                            failed attempts with a cold snare. Resection and                            retrieval were complete.                           The exam was otherwise without abnormality on                            direct and retroflexion views. Complications:            No immediate complications. Estimated blood loss:                            Minimal. Estimated Blood Loss:     Estimated blood loss was minimal. Impression:               - Preparation of the colon was poor.                           - Diverticulosis in the sigmoid colon, in the                            descending colon, in the ascending colon and in the                            cecum.                           - One 2 mm polyp in the ascending colon, removed  with a cold snare. Resected and retrieved.                           - Four 3 to 8 mm polyps at the ileocecal valve,                             removed piecemeal using a cold snare. Resected and                            retrieved. Injected.                           - Seven 2 to 8 mm polyps in the sigmoid colon and                            in the descending colon, removed with a cold snare.                            Resected and retrieved.                           - Two 2 to 3 mm polyps in the rectum, removed with                            a cold biopsy forceps. Resected and retrieved.                           - The examination was otherwise normal on direct                            and retroflexion views. Recommendation:           - Patient has a contact number available for                            emergencies. The signs and symptoms of potential                            delayed complications were discussed with the                            patient. Return to normal activities tomorrow.                            Written discharge instructions were provided to the                            patient.                           - Resume regular diet. High fiber diet recommended.                           - Continue present medications.                           -  Resume warfarin (Coumadin) in 2 days.                           - Await pathology results.                           - Repeat colonoscopy within 3 months for                            surveillance based on pathology results given the                            piecemeal resection of the IC valve polyp.                           - Given the number of polyps, genetic testing                            should be considered.                           - Two day prep with future procedures. Thornton Park MD, MD 02/10/2019 9:04:09 AM This report has been signed electronically.

## 2019-05-18 ENCOUNTER — Encounter: Payer: Self-pay | Admitting: Gastroenterology

## 2019-05-18 ENCOUNTER — Encounter: Payer: Self-pay | Admitting: Internal Medicine

## 2019-05-18 ENCOUNTER — Other Ambulatory Visit: Payer: Self-pay

## 2019-05-18 ENCOUNTER — Ambulatory Visit (INDEPENDENT_AMBULATORY_CARE_PROVIDER_SITE_OTHER): Payer: No Typology Code available for payment source | Admitting: Internal Medicine

## 2019-05-18 VITALS — BP 136/78 | HR 85 | Temp 98.0°F | Ht 69.29 in | Wt 212.8 lb

## 2019-05-18 DIAGNOSIS — Z77090 Contact with and (suspected) exposure to asbestos: Secondary | ICD-10-CM

## 2019-05-18 DIAGNOSIS — J439 Emphysema, unspecified: Secondary | ICD-10-CM

## 2019-05-18 DIAGNOSIS — Z720 Tobacco use: Secondary | ICD-10-CM | POA: Diagnosis not present

## 2019-05-18 DIAGNOSIS — J449 Chronic obstructive pulmonary disease, unspecified: Secondary | ICD-10-CM

## 2019-05-18 NOTE — Progress Notes (Signed)
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  Lives alone.  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 He says he does NOT want to be evaluated for OSA. "I'm paying for this, I was told I had mesothelioma and fluid on my lungs and they went away" He wants update evaluation for these issues. Medical problem list includes Tobacco use, OSA, HBP, CAD, Hx PE/ warfarin, DM 2, CVA, allergic rhinitis, chronic headaches Body weight 212 lbs, Arrival sat on room air 98%, HR 85/ min. 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  He understood his primary care office to say he had fluid in his lungs and an APP at that office told him on chest exam it sounded as if he might have mesothelioma.  He denies increased cough, has occasional scant clear sputum with no chest pain.  Describes CVA first of year leaving him somewhat weak, nonfocal.  Not sure if he has some dyspnea on exertion or is just weak.  Notices some sneezing.  Rescue inhaler helps "some" used occasionally.  Had a chest x-ray done at [facility]. ENT_ UPPP CAD+ 2 stents CT chest 05/18/04- CT CHEST WITHOUT CONTRAST Helical CT of the chest was performed without IV contrast because this patient has a history of significant contrast reaction.  The chest wall, soft tissues and bony structures are unremarkable.  Heart size is normal.  No pericardial effusion.  Scattered coronary artery calcifications are noted.  No mediastinal or hilar adenopathy.  The esophagus appears normal.   The aorta is normal in caliber.  No significant atherosclerotic change or calcification.  Examination of the lung parenchyma demonstrates no acute pulmonary findings.  No pulmonary masses or worrisome pulmonary nodules.  There is an area of subpleural atelectasis in the right middle lobe and minimal dependent atelectasis.  The upper abdomen demonstrates no  significant findings.  There is a small accessory spleen and there are a few tiny calcifications scattered in the pancreatic head area.   IMPRESSION-Unremarkable noncontrast CT examination of the chest.  Prior to Admission medications   Medication Sig Start Date End Date Taking? Authorizing Provider  aspirin 81 MG tablet Take 81 mg by mouth daily.   Yes [provider]  atorvastatin (LIPITOR) 80 MG tablet Take 80 mg by mouth daily.   Yes [provider]  cholecalciferol (VITAMIN D3) 25 MCG (1000 UT) tablet Take 1,000 Units by mouth daily.   Yes [provider]  clonazePAM (KLONOPIN) 0.5 MG tablet Take 0.5 mg by mouth 2 (two) times daily as needed for anxiety.   Yes [provider]  cyanocobalamin 1000 MCG tablet Take 100 mcg by mouth daily.   Yes [provider]  divalproex (DEPAKOTE) 500 MG DR tablet Take 1,500 mg by mouth at bedtime.   Yes [provider]  finasteride (PROSCAR) 5 MG tablet Take 5 mg by mouth daily.   Yes [provider]  glipiZIDE (GLUCOTROL) 10 MG tablet Take 10 mg by mouth 2 (two) times daily before a meal.   Yes [provider]  HYDROcodone-acetaminophen (NORCO/VICODIN) 5-325 MG per tablet Take 1 tablet by mouth every 6 (six) hours as needed for moderate pain.   Yes [provider]  insulin aspart protamine- aspart (NOVOLOG MIX 70/30) (70-30) 100 UNIT/ML injection Inject 25 Units into the skin 2 (two) times daily with a meal.  Yes [provider]  lisinopril (PRINIVIL,ZESTRIL) 40 MG tablet Take 40 mg by mouth daily.   Yes [provider]  metoprolol tartrate (LOPRESSOR) 50 MG tablet Take 25 mg by mouth daily.   Yes [provider]  omeprazole (PRILOSEC) 20 MG capsule Take 20 mg by mouth daily.   Yes [provider]  pantoprazole (PROTONIX) 40 MG tablet Take 40 mg by mouth daily.   Yes [provider]  tamsulosin (FLOMAX) 0.4 MG CAPS capsule Take 0.4  mg by mouth daily.   Yes [provider]  Vitamin D, Ergocalciferol, (DRISDOL) 50000 units CAPS capsule Take 50,000 Units by mouth every 7 (seven) days.   Yes [provider]  warfarin (COUMADIN) 5 MG tablet Take 5 mg by mouth daily.   Yes [provider]   Past Medical History:  Diagnosis Date  . Anemia   . Anxiety   . Benign brain tumor ([place])   . Benign prostate hyperplasia   . COPD (chronic obstructive pulmonary disease) ([place])   . Coronary artery disease   . Depression   . Diabetes mellitus without complication ([place])    Type II  . Head injury    'years ago'  . Hypertension   . Pneumonia   . Pulmonary embolism [facility])    Past Surgical History:  Procedure Laterality Date  . BACK SURGERY     cervical and lumbar x 3 surgeries  . CARDIAC CATHETERIZATION  2015  . CATARACT EXTRACTION Bilateral 06/13/2017  . HERNIA REPAIR     umbilical  . HERNIA REPAIR     2010  . ROTATOR CUFF REPAIR Bilateral 12/2014   twice on the left and once on the right   Family History  Problem Relation Age of Onset  . Colon cancer Neg Hx   . Esophageal cancer Neg Hx   . Rectal cancer Neg Hx    Social History   Socioeconomic History  . Marital status: Divorced    Spouse name: Not on file  . Number of children: 3  . Years of education: Not on file  . Highest education level: Not on file  Occupational History  . Occupation: retired  Scientific laboratory technician  . Financial resource strain: Not on file  . Food insecurity    Worry: Not on file    Inability: Not on file  . Transportation needs    Medical: Not on file    Non-medical: Not on file  Tobacco Use  . Smoking status: Current Every Day Smoker    Packs/day: 0.75    Years: 31.00    Pack years: 23.25    Types: Cigarettes  . Smokeless tobacco: Never Used  Substance and Sexual Activity  . Alcohol use: No  . Drug use: No  . Sexual activity: Not Currently  Lifestyle  . Physical activity    Days per week: Not on file     Minutes per session: Not on file  . Stress: Not on file  Relationships  . Social Herbalist on phone: Not on file    Gets together: Not on file    Attends religious service: Not on file    Active member of club or organization: Not on file    Attends meetings of clubs or organizations: Not on file    Relationship status: Not on file  . Intimate partner violence    Fear of current or ex partner: Not on file    Emotionally abused: Not on file  Physically abused: Not on file    Forced sexual activity: Not on file  Other Topics Concern  . Not on file  Social History Narrative  . Not on file   ROS-see HPI   + = positive Constitutional:    weight loss, night sweats, fevers, chills, fatigue, lassitude. HEENT:    +headaches, difficulty swallowing, +tooth/dental problems, sore throat,       +sneezing, itching, ear ache, nasal congestion, post nasal drip, snoring CV:    chest pain, orthopnea, PND, swelling in lower extremities, anasarca,                                  dizziness, palpitations Resp:   shortness of breath with exertion or at rest.                productive cough,   non-productive cough, coughing up of blood.              change in color of mucus.  wheezing.   Skin:    rash or lesions. GI:  No-   heartburn, indigestion, abdominal pain, nausea, vomiting, diarrhea,                 change in bowel habits, loss of appetite GU: dysuria, change in color of urine, no urgency or frequency.   flank pain. MS:   joint pain, stiffness, decreased range of motion, back pain. Neuro-     nothing unusual Psych:  change in mood or affect.  depression or anxiety.   memory loss.  OBJ- Physical Exam General- Alert, Oriented, Affect-appropriate, Distress- none acute Skin- rash-none, lesions- none, excoriation- none Lymphadenopathy- none Head- atraumatic            Eyes- Gross vision intact, PERRLA, conjunctivae and secretions clear            Ears- Hearing, canals-normal             Nose- Clear, no-Septal dev, mucus, polyps, erosion, perforation             Throat- Mallampati III , mucosa clear , drainage- none, tonsils- atrophic Neck- flexible , trachea midline, no stridor , thyroid nl, carotid no bruit Chest - symmetrical excursion , unlabored           Heart/CV- RRR , no murmur , no gallop  , no rub, nl s1 s2                           - JVD- none , edema- none, stasis changes- none, varices- none           Lung- + fine rhonchi/ unlabored,   wheeze- none, cough- none , dullness-none, rub- none           Chest wall-  Abd-  Br/ Gen/ Rectal- Not done, not indicated Extrem- cyanosis- none, clubbing, none, atrophy- none, strength- nl Neuro- grossly intact to observation

## 2019-05-18 NOTE — Patient Instructions (Signed)
Order- schedule CT chest Hi Resolution      Dx asbestos exposure  Order- schedule PFT    Dx COPD mixed type  Please call if we can help

## 2019-05-19 ENCOUNTER — Telehealth: Payer: Self-pay | Admitting: Internal Medicine

## 2019-05-19 NOTE — Telephone Encounter (Signed)
?   Who called him- LMTCB

## 2019-05-20 NOTE — Telephone Encounter (Signed)
Checked with [facility] and this patient was scheduled for CT on 06/08/19 with [facility] but it was cancelled. Patient was going to have the CT done at another location.   Called the patient and he stated he has been set up for the CT on 05/22/19 at [facility]. I asked the patient to let them know he wants the results forwarded to our clinic and to sign a medical release that will go to their medical records department. Patient voiced understanding. Nothing further needed at this time.

## 2019-05-23 DIAGNOSIS — Z720 Tobacco use: Secondary | ICD-10-CM | POA: Insufficient documentation

## 2019-05-23 DIAGNOSIS — Z77090 Contact with and (suspected) exposure to asbestos: Secondary | ICD-10-CM | POA: Insufficient documentation

## 2019-05-23 NOTE — Assessment & Plan Note (Signed)
He describes significant direct asbestos exposure in the [employer] in the 1960s.  Associated changes were not recognized on chest CT in 2005.  Apparently he understood from a physical exam that he might have mesothelioma-this sounds pretty tentative. Plan-high resolution CT chest

## 2019-05-23 NOTE — Assessment & Plan Note (Signed)
Probable COPD.  He agrees to CT scan of chest and PFT.  Smoking cessation.

## 2019-06-08 ENCOUNTER — Ambulatory Visit (HOSPITAL_COMMUNITY): Payer: Non-veteran care

## 2019-06-26 ENCOUNTER — Telehealth: Payer: Self-pay | Admitting: Gastroenterology

## 2019-06-26 NOTE — Telephone Encounter (Signed)
Spoke to Verdis Frederickson, faxed Verdis Frederickson requested information to [phone].

## 2019-06-26 NOTE — Telephone Encounter (Signed)
Verdis Frederickson from the patients [place] office [phone] call requesting for the nurse to send in a request for authorization for  the patient to get his repeat colon schedule.

## 2019-07-30 ENCOUNTER — Encounter: Payer: Self-pay | Admitting: Gastroenterology

## 2019-07-30 ENCOUNTER — Ambulatory Visit (INDEPENDENT_AMBULATORY_CARE_PROVIDER_SITE_OTHER): Payer: No Typology Code available for payment source | Admitting: Gastroenterology

## 2019-07-30 VITALS — BP 122/60 | HR 80 | Temp 98.7°F | Ht 68.0 in | Wt 207.0 lb

## 2019-07-30 DIAGNOSIS — Z86010 Personal history of colonic polyps: Secondary | ICD-10-CM

## 2019-07-30 MED ORDER — NA SULFATE-K SULFATE-MG SULF 17.5-3.13-1.6 GM/177ML PO SOLN: 1.0000 | ORAL | 0 refills | Status: DC

## 2019-07-30 NOTE — Patient Instructions (Signed)
Tips for colonoscopy:  - Stay well hydrated for 3-4 days prior to the exam. This reduces nausea and dehydration.  - To prevent skin/hemorrhoid irritation - prior to wiping, put A&Dointment or vaseline on the toilet paper. - Keep a towel or pad on the bed.  - Drink  64oz of clear liquids in the morning of prep day (prior to starting the prep) to be sure that there is enough fluid to flush the colon and stay hydrated!!!! This is in addition to the fluids required for preparation. - Use of a flavored hard candy, such as grape Anise Salvo, can counteract some of the flavor of the prep and may prevent some nausea.

## 2019-07-30 NOTE — Progress Notes (Signed)
Referring Provider: No ref. 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    - Empiric dilation performed with a 20 French Maloney dilator    - No esophageal biopsies were obtained On chronic warfarin for coronary artery stents  Colonoscopy off warfarin recommended to insure complete resection of the large IC valve polyp that was resected in a piecemeal fashion.   Patient requested a Tuesday appointment.   PLAN: Colonoscopy after a warfarin washout (Dr. Melina Copa is the prescribing doctor, [phone])) Encourage the patient to proceed with genetic counseling   HPI: Mannan Debaker. is a 73 y.o. male in follow-up after a screening colonoscopy that revealed a large IC valve polyp.  The interval  history is obtained to the patient and review of his electronic health record as well as his referral records from the [place].  He has history of cardiac arrest in the 1980s, CAD with stents placed in [place], hypertension, posttraumatic stress disorder, sciatica, obstructive sleep apnea on CPAP, TIA, type 2 diabetes, tobacco use, anxiety, and PE x 2 on long-term use of warfarin. Hemorrhoidectomy 11 years ago.   Colonoscopy 02/11/2008: - Multiple small and large-mouthed diverticula were found in the sigmoid colon, descending colon, ascending colon and cecum. - A 2 mm polyp was found in the ascending colon. The polyp was sessile. The polyp was removed with a cold snare. Resection and retrieval were complete. - Four multi-lobulated polyps were found on the fold of the ileocecal valve. The polyps abutted two diverticulum. The polyps were 3 to 8 mm in size. Area was successfully injected with 2 mL saline for a lift polypectomy. These  polyps were removed with a piecemeal technique using a cold snare. Resection and retrieval were complete. Estimated blood loss was minimal. - Seven sessile polyps were found in the sigmoid colon and descending colon. The polyps were 2 to 8 mm in size. These polyps were removed with a cold snare. Resection and retrieval were complete. Estimated blood loss was minimal. - Two sessile polyps were found in the rectum. The polyps were 2 to 3 mm in size. These polyps were removed with a cold biopsy forceps after multiple failed attempts with a cold snare. Resection and retrieval were complete.  All of the removed polyps were tubular adenomas (11) except for the rectal polyps (2) which were hyperplastic.   He declined a genetic consultation as recommended.   He had a normal colonoscopy in 2009. He has noticed some black, near tarry stools. No other ongoing GI symptoms.   Endoscopic history: Colonoscopy 2009: Normal EGD with dilation for dysphagia at [facility] 08/15/18: grossly normal-appearing esophagus.  Esophageal dilation with a 56 French Maloney dilator was performed given the patient complaints of solid food dysphasia.  Mild antral erythema was noted.  Gastric biopsies were obtained and were negative for H. pylori.  Colonoscopy 02/10/19: 11 tubular adenomas, including piecemeal resection at the IC valve    Past Medical History:  Diagnosis Date  . Anemia   . Anxiety   . Benign brain tumor ([place])   . Benign prostate hyperplasia   . COPD (chronic obstructive pulmonary disease) ([place])   . Coronary artery disease   . Depression   .  Diabetes mellitus without complication ([place])    Type II  . Head injury    'years ago'  . Hypertension   . Pneumonia   . Pulmonary embolism [facility])     Past Surgical History:  Procedure Laterality Date  . BACK SURGERY     cervical and lumbar x 3 surgeries  . CARDIAC CATHETERIZATION  2015  . CATARACT EXTRACTION Bilateral 06/13/2017  . HERNIA REPAIR     umbilical   . HERNIA REPAIR     2010  . ROTATOR CUFF REPAIR Bilateral 12/2014   twice on the left and once on the right    Current Outpatient Medications  Medication Sig Dispense Refill  . aspirin 81 MG tablet Take 81 mg by mouth daily.    Marland Kitchen atorvastatin (LIPITOR) 80 MG tablet Take 80 mg by mouth daily.    . cholecalciferol (VITAMIN D3) 25 MCG (1000 UT) tablet Take 1,000 Units by mouth daily.    . clonazePAM (KLONOPIN) 0.5 MG tablet Take 0.5 mg by mouth 2 (two) times daily as needed for anxiety.    . cyanocobalamin 1000 MCG tablet Take 100 mcg by mouth daily.    . divalproex (DEPAKOTE) 500 MG DR tablet Take 1,500 mg by mouth at bedtime.    . finasteride (PROSCAR) 5 MG tablet Take 5 mg by mouth daily.    Marland Kitchen glipiZIDE (GLUCOTROL) 10 MG tablet Take 10 mg by mouth 2 (two) times daily before a meal.    . HYDROcodone-acetaminophen (NORCO/VICODIN) 5-325 MG per tablet Take 1 tablet by mouth every 6 (six) hours as needed for moderate pain.    Marland Kitchen lisinopril (PRINIVIL,ZESTRIL) 40 MG tablet Take 40 mg by mouth daily.    . metoprolol tartrate (LOPRESSOR) 50 MG tablet Take 25 mg by mouth daily.    Marland Kitchen omeprazole (PRILOSEC) 20 MG capsule Take 20 mg by mouth daily.    . pantoprazole (PROTONIX) 40 MG tablet Take 40 mg by mouth daily.    . tamsulosin (FLOMAX) 0.4 MG CAPS capsule Take 0.4 mg by mouth daily.    . Vitamin D, Ergocalciferol, (DRISDOL) 50000 units CAPS capsule Take 50,000 Units by mouth every 7 (seven) days.    Marland Kitchen warfarin (COUMADIN) 5 MG tablet Take 5 mg by mouth daily.     No current facility-administered medications for this visit.     Allergies as of 07/30/2019 - Review Complete 07/30/2019  Allergen Reaction Noted  . Bee venom Swelling 04/19/2015  . Cortisone Other (See Comments) 04/19/2015  . Morphine and related  05/18/2019  . Onion Other (See Comments) 04/19/2015    Family History  Problem Relation Age of Onset  . Diabetes Mother   . Heart disease Father   . Colon cancer Neg Hx   .  Esophageal cancer Neg Hx   . Rectal cancer Neg Hx   . Pancreatic cancer Neg Hx     Social History   Socioeconomic History  . Marital status: Divorced    Spouse name: Not on file  . Number of children: 3  . Years of education: Not on file  . Highest education level: Not on file  Occupational History  . Occupation: retired  Scientific laboratory technician  . Financial resource strain: Not on file  . Food insecurity    Worry: Not on file    Inability: Not on file  . Transportation needs    Medical: Not on file    Non-medical: Not on file  Tobacco Use  . Smoking status: Current Every  Day Smoker    Packs/day: 0.75    Years: 31.00    Pack years: 23.25    Types: Cigarettes  . Smokeless tobacco: Never Used  Substance and Sexual Activity  . Alcohol use: No  . Drug use: No  . Sexual activity: Not Currently  Lifestyle  . Physical activity    Days per week: Not on file    Minutes per session: Not on file  . Stress: Not on file  Relationships  . Social Herbalist on phone: Not on file    Gets together: Not on file    Attends religious service: Not on file    Active member of club or organization: Not on file    Attends meetings of clubs or organizations: Not on file    Relationship status: Not on file  . Intimate partner violence    Fear of current or ex partner: Not on file    Emotionally abused: Not on file    Physically abused: Not on file    Forced sexual activity: Not on file  Other Topics Concern  . Not on file  Social History Narrative  . Not on file    Review of Systems: 12 system ROS is negative except as noted above with the additions of back pain, headaches, muscle pains, shortness of breath, and a heart murmur.  Filed Weights   07/30/19 0906  Weight: 207 lb (93.9 kg)    Physical Exam: Vital signs were reviewed. General:   Alert, well-nourished, pleasant and cooperative in NAD Head:  Normocephalic and atraumatic. Eyes:  Sclera clear, no icterus.   Conjunctiva  pink. Mouth:  No deformity or lesions.   Neck:  Supple; no thyromegaly. Lungs:  Clear throughout to auscultation.   No wheezes.  Heart:  Regular rate and rhythm; no murmurs Abdomen:  Soft, nontender, normal bowel sounds. No rebound or guarding. No hepatosplenomegaly Rectal:  Deferred  Msk:  Symmetrical without gross deformities. Extremities:  No gross deformities or edema. Neurologic:  Alert and  oriented x4;  grossly nonfocal Skin:  No rash or bruise. Psych:  Alert and cooperative. Normal mood and affect.   Tereasa Yilmaz L. Tarri Glenn, MD, MPH [facility] 07/30/2019, 9:27 AM

## 2019-09-11 ENCOUNTER — Telehealth: Payer: Self-pay | Admitting: Internal Medicine

## 2019-09-11 NOTE — Telephone Encounter (Signed)
Called and spoke to patient. Patient was scheduled for PFT and office visit with Dr. Annamaria Boots for 09/18/2019.  Patient stated he will not get covid testing and that he doesn't feel like he needs a PFT. Patient wanted to keep OV with Dr. Annamaria Boots. Canceled PFT. Nothing further needed at this time.   Routing to Dr. Annamaria Boots as Juluis Rainier.

## 2019-09-18 ENCOUNTER — Other Ambulatory Visit: Payer: Self-pay

## 2019-09-18 ENCOUNTER — Ambulatory Visit (INDEPENDENT_AMBULATORY_CARE_PROVIDER_SITE_OTHER): Payer: Medicare HMO | Admitting: Internal Medicine

## 2019-09-18 ENCOUNTER — Encounter: Payer: Self-pay | Admitting: Internal Medicine

## 2019-09-18 DIAGNOSIS — Z720 Tobacco use: Secondary | ICD-10-CM | POA: Diagnosis not present

## 2019-09-18 DIAGNOSIS — Z77090 Contact with and (suspected) exposure to asbestos: Secondary | ICD-10-CM

## 2019-09-18 NOTE — Patient Instructions (Signed)
We will get the report and disc of your CT scan from [facility].   Please call if we can help.

## 2019-09-18 NOTE — Assessment & Plan Note (Signed)
Asymptomatic. He had his CT scan done outside our system, so we are requesting report and disc from [facility]. It sounds as if the [facility] provider who examined him and suggested "mesothelioma and fluid" may have been speculating without imaging. Warning symptoms reviewed.  Plan- CT from [facility]

## 2019-09-18 NOTE — Assessment & Plan Note (Signed)
He is not prepared to stop smoking, despite education about combined risk of smoking with asbestos exposure.

## 2019-09-18 NOTE — Progress Notes (Signed)
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  Lives alone.  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 He says he does NOT want to be evaluated for OSA. "I'm paying for this, I was told I had mesothelioma and fluid on my lungs and they went away" He wants update evaluation for these issues. Medical problem list includes Tobacco use, OSA, HBP, CAD, Hx PE/ warfarin, DM 2, CVA, allergic rhinitis, chronic headaches Body weight 212 lbs, Arrival sat on room air 98%, HR 85/ min. 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  He understood his primary care office to say he had fluid in his lungs and an APP at that office told him on chest exam it sounded as if he might have mesothelioma.  He denies increased cough, has occasional scant clear sputum with no chest pain.  Describes CVA first of year leaving him somewhat weak, nonfocal.  Not sure if he has some dyspnea on exertion or is just weak.  Notices some sneezing.  Rescue inhaler helps "some" used occasionally.  Had a chest x-ray done at [facility]. ENT_ UPPP CAD+ 2 stents CT chest 05/18/04- CT CHEST WITHOUT CONTRAST Helical CT of the chest was performed without IV contrast because this patient has a history of significant contrast reaction.  The chest wall, soft tissues and bony structures are unremarkable.  Heart size is normal.  No pericardial effusion.  Scattered coronary artery calcifications are noted.  No mediastinal or hilar adenopathy.  The esophagus appears normal.   The aorta is normal in caliber.  No significant atherosclerotic change or calcification.  Examination of the lung parenchyma demonstrates no acute pulmonary findings.  No pulmonary masses or worrisome pulmonary nodules.  There is an area of subpleural atelectasis in the right middle lobe and minimal dependent atelectasis.  The upper abdomen demonstrates no  significant findings.  There is a small accessory spleen and there are a few tiny calcifications scattered in the pancreatic head area.   IMPRESSION-Unremarkable noncontrast CT examination of the chest.  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 At last visit we scheduled PFT and CT HR chest, which weren't done. -----pt states he had CT done in [facility], pt declined PFT d/t refusal of COVID test; pt states he uses CPAP nightly, no complaints He says covid test was [facility] for [facility] about a month ago, so he didn't want to do another here per our protocol before doing PFT. Says breathing is "fine" without chest pain, dyspnea or cough. Occasional sneeze. Had flu vaccine.  ROS-see HPI   + = positive Constitutional:    weight loss, night sweats, fevers, chills, fatigue, lassitude. HEENT:    +headaches, difficulty swallowing, +tooth/dental problems, sore throat,       +sneezing, itching, ear ache, nasal congestion, post nasal drip, snoring CV:    chest pain, orthopnea, PND, swelling in lower extremities, anasarca,  dizziness, palpitations Resp:   shortness of breath with exertion or at rest.                productive cough,   non-productive cough, coughing up of blood.              change in color of mucus.  wheezing.   Skin:    rash or lesions. GI:  No-   heartburn, indigestion, abdominal pain, nausea, vomiting, diarrhea,                 change in bowel habits, loss of appetite GU: dysuria, change in color of urine, no urgency or frequency.   flank pain. MS:   joint pain, stiffness, decreased range of motion, back pain. Neuro-     nothing unusual Psych:  change in mood or  affect.  depression or anxiety.   memory loss.  OBJ- Physical Exam General- Alert, Oriented, Affect-appropriate, Distress- none acute Skin- rash-none, lesions- none, excoriation- none Lymphadenopathy- none Head- atraumatic            Eyes- Gross vision intact, PERRLA, conjunctivae and secretions clear            Ears- Hearing, canals-normal            Nose- Clear, no-Septal dev, mucus, polyps, erosion, perforation             Throat- Mallampati III , mucosa clear , drainage- none, tonsils- atrophic Neck- flexible , trachea midline, no stridor , thyroid nl, carotid no bruit Chest - symmetrical excursion , unlabored           Heart/CV- RRR , no murmur , no gallop  , no rub, nl s1 s2                           - JVD- none , edema- none, stasis changes- none, varices- none           Lung- + minimal crackle left base,   wheeze- none, cough- none , dullness-none, rub- none           Chest wall-  Abd-  Br/ Gen/ Rectal- Not done, not indicated Extrem- cyanosis- none, clubbing, none, atrophy- none, strength- nl Neuro- grossly intact to observation

## 2019-10-21 ENCOUNTER — Encounter: Payer: Self-pay | Admitting: Gastroenterology

## 2019-10-21 ENCOUNTER — Other Ambulatory Visit: Payer: Self-pay

## 2019-10-21 ENCOUNTER — Ambulatory Visit (AMBULATORY_SURGERY_CENTER): Payer: No Typology Code available for payment source | Admitting: Gastroenterology

## 2019-10-21 VITALS — BP 166/87 | HR 85 | Temp 98.0°F | Resp 22 | Ht 68.0 in | Wt 207.0 lb

## 2019-10-21 DIAGNOSIS — D120 Benign neoplasm of cecum: Secondary | ICD-10-CM

## 2019-10-21 DIAGNOSIS — D122 Benign neoplasm of ascending colon: Secondary | ICD-10-CM | POA: Diagnosis not present

## 2019-10-21 DIAGNOSIS — Z86010 Personal history of colonic polyps: Secondary | ICD-10-CM

## 2019-10-21 DIAGNOSIS — D124 Benign neoplasm of descending colon: Secondary | ICD-10-CM | POA: Diagnosis not present

## 2019-10-21 DIAGNOSIS — D125 Benign neoplasm of sigmoid colon: Secondary | ICD-10-CM

## 2019-10-21 MED ORDER — SODIUM CHLORIDE 0.9 % IV SOLN: 500.0000 mL | Freq: Once | INTRAVENOUS | Status: DC

## 2019-10-21 NOTE — Patient Instructions (Signed)
YOU HAD AN ENDOSCOPIC PROCEDURE TODAY AT [facility]:   Refer to the procedure report that was given to you for any specific questions about what was found during the examination.  If the procedure report does not answer your questions, please call your gastroenterologist to clarify.  If you requested that your care partner not be given the details of your procedure findings, then the procedure report has been included in a sealed envelope for you to review at your convenience later.  YOU SHOULD EXPECT: Some feelings of bloating in the abdomen. Passage of more gas than usual.  Walking can help get rid of the air that was put into your GI tract during the procedure and reduce the bloating. If you had a lower endoscopy (such as a colonoscopy or flexible sigmoidoscopy) you may notice spotting of blood in your stool or on the toilet paper. If you underwent a bowel prep for your procedure, you may not have a normal bowel movement for a few days.  Please Note:  You might notice some irritation and congestion in your nose or some drainage.  This is from the oxygen used during your procedure.  There is no need for concern and it should clear up in a day or so.  SYMPTOMS TO REPORT IMMEDIATELY:   Following lower endoscopy (colonoscopy or flexible sigmoidoscopy):  Excessive amounts of blood in the stool  Significant tenderness or worsening of abdominal pains  Swelling of the abdomen that is new, acute  Fever of 100F or higher   For urgent or emergent issues, a gastroenterologist can be reached at any hour by calling [phone].   DIET:  We do recommend a small meal at first, but then you may proceed to your regular diet.  Drink plenty of fluids but you should avoid alcoholic beverages for 24 hours.  ACTIVITY:  You should plan to take it easy for the rest of today and you should NOT DRIVE or use heavy machinery until tomorrow (because of the sedation medicines used during the test).     FOLLOW UP: Our staff will call the number listed on your records 48-72 hours following your procedure to check on you and address any questions or concerns that you may have regarding the information given to you following your procedure. If we do not reach you, we will leave a message.  We will attempt to reach you two times.  During this call, we will ask if you have developed any symptoms of COVID 19. If you develop any symptoms (ie: fever, flu-like symptoms, shortness of breath, cough etc.) before then, please call [phone].  If you test positive for Covid 19 in the 2 weeks post procedure, please call and report this information to [place].    If any biopsies were taken you will be contacted by phone or by letter within the next 1-3 weeks.  Please call us at [phone] if you have not heard about the biopsies in 3 weeks.    SIGNATURES/CONFIDENTIALITY: You and/or your care partner have signed paperwork which will be entered into your electronic medical record.  These signatures attest to the fact that that the information above on your After Visit Summary has been reviewed and is understood.  Full responsibility of the confidentiality of this discharge information lies with you and/or your care-partner.    Handouts were given to you on polyps and diverticulosis. Your blood sugar was 131 in the recovery room. Resume your COUMADIN in two days  on Friday. You may resume your other current medications today. Await biopsy results. Please call if any questions or concerns.

## 2019-10-21 NOTE — Progress Notes (Signed)
History reviewed today  Temp JB, VS CW  

## 2019-10-21 NOTE — Op Note (Addendum)
[place] Patient Name: Rodney Moyer Procedure Date: 10/21/2019 8:44 AM MRN: [id number] Endoscopist: Thornton Park MD, MD Age: 73 Referring MD:  Date of Birth: [birth year] Gender: Male Account #: [id number] Procedure:                Colonoscopy Indications:              Surveillance: Personal history of piecemeal removal                            of adenoma on last colonoscopy 6 months ago                           15 polyps on colonoscopy 02/11/19 - including                            multiple polyps removed in a piecemeal fashion on                            the fold of the ICV                           Surveillance recommended in 3-6 months to insure                            complete resection. Medicines:                Monitored Anesthesia Care Procedure:                Pre-Anesthesia Assessment:                           - Prior to the procedure, a History and Physical                            was performed, and patient medications and                            allergies were reviewed. The patient's tolerance of                            previous anesthesia was also reviewed. The risks                            and benefits of the procedure and the sedation                            options and risks were discussed with the patient.                            All questions were answered, and informed consent                            was obtained. Prior Anticoagulants: The patient has  taken Coumadin (warfarin), last dose was 5 days                            prior to procedure. ASA Grade Assessment: III - A                            patient with severe systemic disease. After                            reviewing the risks and benefits, the patient was                            deemed in satisfactory condition to undergo the                            procedure.                           After obtaining informed consent, the  colonoscope                            was passed under direct vision. Throughout the                            procedure, the patient's blood pressure, pulse, and                            oxygen saturations were monitored continuously. The                            Colonoscope was introduced through the anus and                            advanced to the the cecum, identified by                            appendiceal orifice and ileocecal valve. The                            colonoscopy was performed with difficulty due to a                            redundant colon, significant looping and a tortuous                            colon. Successful completion of the procedure was                            aided by applying abdominal pressure. The patient                            tolerated the procedure well. The quality of the  bowel preparation was adequate however there were                            large food particles in the right colon limiting a                            full evaluation. I tried to move these pieces                            without success. They were too large to be                            suctioned through the scope and repeatedly clogged                            the colonoscope. The ileocecal valve and rectum                            were photographed. The appendiceal orifice could                            not be seen due to the retained food fibers. Scope In: 8:56:55 AM Scope Out: 9:22:33 AM Scope Withdrawal Time: 0 hours 20 minutes 14 seconds  Total Procedure Duration: 0 hours 25 minutes 38 seconds  Findings:                 The perianal and digital rectal examinations were                            normal.                           Multiple small and large-mouthed diverticula were                            found in the sigmoid colon and descending colon.                           Four flat, nearly confluent  polyps were found on                            the fold adjacent to the ileocecal valve. These                            appeared to be residual polyps from those removed                            on colonoscopy in March. The polyps were 2 to 4 mm                            in size and abutted two diverticulum. These polyps  were removed with a cold snare and then with cold                            forceps to be sure that the base was removed.                            Resection was complicated by the residual stool and                            retrieval were complete. Estimated blood loss was                            minimal. The entirity of the cecum was not seen on                            this exam due to residual food fibers.                           A 2 mm polyp was found in the ascending colon. The                            polyp was sessile. The polyp was removed with a                            cold biopsy forceps after a failed attempt to use a                            snare due to both retained stool clogging the snare                            and the flat nature of the polyp. Resection and                            retrieval were complete. Estimated blood loss was                            minimal.                           A 3 mm polyp was found in the ascending colon. The                            polyp was sessile. The polyp was removed with a                            cold snare. Resection and retrieval were complete.                            Estimated blood loss was minimal.                           Two sessile polyps were found  in the sigmoid colon                            and descending colon. The polyps were 2 to 4 mm in                            size. These polyps were removed with a cold snare.                            Resection and retrieval were complete. Estimated                            blood loss was  minimal.                           The exam was otherwise without abnormality on                            direct and retroflexion views. Complications:            No immediate complications. Estimated blood loss:                            Minimal. Estimated Blood Loss:     Estimated blood loss was minimal. Impression:               - Diverticulosis in the sigmoid colon and in the                            descending colon.                           - Three 2 to 4 mm polyps at the ileocecal valve,                            removed with a cold snare. Resected and retrieved.                           - One 2 mm polyp in the ascending colon, removed                            with a cold biopsy forceps. Resected and retrieved.                           - One 3 mm polyp in the ascending colon, removed                            with a cold snare. Resected and retrieved.                           - Two 2 to 4 mm polyps in the sigmoid colon and in                            the descending colon,  removed with a cold snare.                            Resected and retrieved.                           - The examination was otherwise normal on direct                            and retroflexion views. Recommendation:           - Patient has a contact number available for                            emergencies. The signs and symptoms of potential                            delayed complications were discussed with the                            patient. Return to normal activities tomorrow.                            Written discharge instructions were provided to the                            patient.                           - Resume previous diet today. High fiber diet                            encouraged.                           - Resume Coumadin (warfarin) at prior dose in 2                            days.                           - Await pathology results.                            - Repeat colonoscopy in 6 months to review the                            polypectomy site. Plan an extended diet at that                            time to minimize residual food fibers in the colon. Thornton Park MD, MD 10/21/2019 9:35:28 AM This report has been signed electronically.

## 2019-10-21 NOTE — Progress Notes (Signed)
Called to room to assist during endoscopic procedure.  Patient ID and intended procedure confirmed with present staff. Received instructions for my participation in the procedure from the performing physician.  

## 2019-10-21 NOTE — Progress Notes (Signed)
Report to PACU, RN, vss, BBS= Clear.  

## 2019-10-21 NOTE — Progress Notes (Addendum)
   Pt did not want me to go over the discharge instructions.  He repeatledy told me "this is not my first rodeo.  I know watht to do".  I did say out loud to restart his COUMADIN in two day on Friday and get back taking his other regular medications.  I circled and highlighted telephone numbers to call that were. 24 hr hours.   I asked if it was any thing I could do to make him feel better.  Pt responded, "I am cold".  I covered him up. Corky Sing

## 2019-10-23 ENCOUNTER — Telehealth: Payer: Self-pay

## 2019-10-23 NOTE — Telephone Encounter (Signed)
LVM

## 2019-10-23 NOTE — Telephone Encounter (Signed)
Pt left a message with the answering service. He was returning your call. Pls call him again.

## 2019-10-27 ENCOUNTER — Telehealth: Payer: Self-pay | Admitting: Internal Medicine

## 2019-10-27 NOTE — Telephone Encounter (Signed)
Horris Latino, have you seen report on pt's CT?

## 2019-10-27 NOTE — Telephone Encounter (Signed)
I think he is referring to a chest CT done at the "[facility]" hospital that used to be called [facility], in [place], [place].  I don't see that report or disk here.

## 2019-10-27 NOTE — Telephone Encounter (Signed)
Dr. Annamaria Boots, please advise if you have received pt's CT which was done at [facility] about a moth ago. Thanks!

## 2019-10-29 NOTE — Telephone Encounter (Signed)
Rodney Moyer is not handling results and messages yet. I looked through his box and we do not have the report.

## 2019-10-29 NOTE — Telephone Encounter (Signed)
Thank you! I will follow up on this.  Called and spoke with pt letting him know that we had not seen the scans yet and pt said he would call them in regards to the scans. Nothing further needed.

## 2020-08-24 ENCOUNTER — Ambulatory Visit: Payer: Non-veteran care | Admitting: Gastroenterology

## 2020-09-14 ENCOUNTER — Telehealth: Payer: Self-pay

## 2020-09-14 ENCOUNTER — Encounter: Payer: Self-pay | Admitting: Gastroenterology

## 2020-09-14 ENCOUNTER — Ambulatory Visit (INDEPENDENT_AMBULATORY_CARE_PROVIDER_SITE_OTHER): Payer: No Typology Code available for payment source | Admitting: Gastroenterology

## 2020-09-14 VITALS — BP 126/76 | HR 92 | Ht 69.0 in | Wt 196.0 lb

## 2020-09-14 DIAGNOSIS — Z86010 Personal history of colonic polyps: Secondary | ICD-10-CM | POA: Diagnosis not present

## 2020-09-14 DIAGNOSIS — Z7901 Long term (current) use of anticoagulants: Secondary | ICD-10-CM

## 2020-09-14 MED ORDER — SUTAB 1479-225-188 MG PO TABS: 1.0000 | ORAL_TABLET | Freq: Once | ORAL | 0 refills | Status: AC

## 2020-09-14 NOTE — Telephone Encounter (Signed)
   Rodney Moyer 1946-03-28 [id number]  Dear Dr. Sharlyne Cai:  We have scheduled the above named patient for a(n) colonoscopy procedure. Our records show that (s)he is on anticoagulation therapy.  Please advise as to whether the patient may come off their therapy of Coumadin 5 days prior to their procedure which is scheduled for 09-21-20.  Please route your response to Tia Alert or fax response to [phone].  Sincerely,    [facility]

## 2020-09-14 NOTE — Progress Notes (Signed)
Referring Provider: No ref. 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    PLAN: Colonoscopy after a 5 day warfarin washout (prescribing doctor of warfarin [phone])  Will proceed with endoscopy. Given advanced age, comorbidities, and chronic use of warfarin, the procedure is high risk. The nature of the procedure, as well as the risks, benefits, and alternatives were carefully and thoroughly reviewed with the patient. Ample time for discussion and questions allowed. The patient understood, was satisfied, and agreed to proceed.  HPI: Rodney Moyer. is a 74 y.o. male in follow-up regarding a large IC valve polyp.    Endoscopic history: - Colonoscopy 2009: Normal - EGD with dilation for dysphagia at [facility] 08/15/18: grossly normal-appearing esophagus.  Esophageal dilation with a 56 French Maloney dilator was performed given the patient complaints of solid food dysphasia.  Mild antral erythema was noted.  Gastric biopsies were obtained and were negative for H. pylori.  - Colonoscopy 02/10/19: 11 tubular adenomas, including piecemeal resection at the IC valve, diverticulosis - Colonoscopy 10/21/19: Residual polyp at the IC valve removed in piecemeal fashion with a cold snare. 3 small tubular adenomas and a hyperplastic polyp. Diverticulosis in the sigmoid colon and  in the descending colon.  Surveillance colonoscopy recommended in 6 months. He returns to schedule a follow-up procedure after having to cancel his initial surveillance colonoscopy.  Denies any ongoing GI symptoms. No blood in the stool. No dysphagia.     Past Medical History:  Diagnosis Date  . Anemia   . Anxiety   . 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 Benign prostate hyperplasia   . Cataract   . COPD (chronic obstructive pulmonary disease) ([place])   . Coronary artery disease   . Depression   . Diabetes mellitus without complication ([place])    Type II  . Head injury    'years ago'  . Hypertension   . Pneumonia   . Pulmonary embolism [facility])     Past Surgical History:  Procedure Laterality Date  . BACK SURGERY     cervical and lumbar x 3 surgeries  . CARDIAC CATHETERIZATION  2015  . CATARACT EXTRACTION Bilateral 06/13/2017  . HERNIA REPAIR     umbilical  . HERNIA REPAIR     2010  . ROTATOR CUFF REPAIR Bilateral 12/2014   twice on the left and once on the right    Current Outpatient Medications  Medication Sig Dispense Refill  . aspirin 81 MG tablet Take 81 mg by mouth daily.    Marland Kitchen atorvastatin (LIPITOR) 80 MG tablet Take 80 mg by mouth daily.    . cholecalciferol (VITAMIN D3) 25 MCG (1000 UT) tablet Take 1,000 Units by mouth daily.    . clonazePAM (KLONOPIN) 0.5 MG tablet Take 0.5 mg by mouth 2 (two) times daily as needed for anxiety.    Marland Kitchen  cyanocobalamin 1000 MCG tablet Take 100 mcg by mouth daily.    . divalproex (DEPAKOTE) 500 MG DR tablet Take 1,500 mg by mouth at bedtime.    . finasteride (PROSCAR) 5 MG tablet Take 5 mg by mouth daily.    Marland Kitchen glipiZIDE (GLUCOTROL) 10 MG tablet Take 10 mg by mouth 2 (two) times daily before a meal.    . HYDROcodone-acetaminophen (NORCO/VICODIN) 5-325 MG per tablet Take 1 tablet by mouth every 6 (six) hours as needed for moderate pain.    Marland Kitchen JARDIANCE 10 MG TABS tablet Take 10 mg by mouth daily.    Marland Kitchen lisinopril (PRINIVIL,ZESTRIL) 40 MG tablet  Take 40 mg by mouth daily.    . metoprolol tartrate (LOPRESSOR) 50 MG tablet Take 25 mg by mouth daily.    . montelukast (SINGULAIR) 10 MG tablet Take 10 mg by mouth daily.    . NON FORMULARY WEEKLY DM diabetic injection    . omeprazole (PRILOSEC) 20 MG capsule Take 20 mg by mouth daily.    Marland Kitchen OZEMPIC, 0.25 OR 0.5 MG/DOSE, 2 MG/1.5ML SOPN Inject 0.5 mg into the skin once a week.    . tamsulosin (FLOMAX) 0.4 MG CAPS capsule Take 0.4 mg by mouth daily.    . Vitamin D, Ergocalciferol, (DRISDOL) 50000 units CAPS capsule Take 50,000 Units by mouth every 7 (seven) days.    Marland Kitchen warfarin (COUMADIN) 5 MG tablet Take 5 mg by mouth daily.    . Sodium Sulfate-Mag Sulfate-KCl (SUTAB) [phone] MG TABS Take 1 kit by mouth once for 1 dose. MANUFACTURER CODES!! BIN: [id number] PCN: CN GROUP: [id number] MEMBER ID: [id number];DJM AS CASH;NO PRIOR AUTHORIZATION 24 tablet 0   No current facility-administered medications for this visit.    Allergies as of 09/14/2020 - Review Complete 09/14/2020  Allergen Reaction Noted  . Other Anaphylaxis 09/14/2020  . Bee venom Swelling 04/19/2015  . Cortisone Other (See Comments) 04/19/2015  . Morphine and related  05/18/2019  . Onion Other (See Comments) 04/19/2015    Family History  Problem Relation Age of Onset  . Diabetes Mother   . Heart disease Father   . Colon cancer Neg Hx   . Esophageal cancer Neg Hx   . Rectal cancer Neg Hx   . Pancreatic cancer Neg Hx   . Stomach cancer Neg Hx     Social History   Socioeconomic History  . Marital status: Divorced    Spouse name: Not on file  . Number of children: 3  . Years of education: Not on file  . Highest education level: Not on file  Occupational History  . Occupation: retired  Tobacco Use  . Smoking status: Current Every Day Smoker    Packs/day: 0.75    Years: 31.00    Pack years: 23.25    Types: Cigarettes  . Smokeless tobacco: Never Used  Vaping Use  . Vaping Use: Never used  Substance and Sexual  Activity  . Alcohol use: No  . Drug use: No  . Sexual activity: Not Currently  Other Topics Concern  . Not on file  Social History Narrative  . Not on file   Social Determinants of Health   Financial Resource Strain:   . Difficulty of Paying Living Expenses: Not on file  Food Insecurity:   . Worried About Charity fundraiser in the Last Year: Not on file  . Ran Out of Food in the Last Year: Not on file  Transportation Needs:   . Lack of Transportation (Medical):  Not on file  . Lack of Transportation (Non-Medical): Not on file  Physical Activity:   . Days of Exercise per Week: Not on file  . Minutes of Exercise per Session: Not on file  Stress:   . Feeling of Stress : Not on file  Social Connections:   . Frequency of Communication with Friends and Family: Not on file  . Frequency of Social Gatherings with Friends and Family: Not on file  . Attends Religious Services: Not on file  . Active Member of Clubs or Organizations: Not on file  . Attends Archivist Meetings: Not on file  . Marital Status: Not on file  Intimate Partner Violence:   . Fear of Current or Ex-Partner: Not on file  . Emotionally Abused: Not on file  . Physically Abused: Not on file  . Sexually Abused: Not on file    Filed Weights   09/14/20 1336  Weight: 196 lb (88.9 kg)    Physical Exam: Vital signs were reviewed. General:   Alert, well-nourished, pleasant and cooperative in NAD Head:  Normocephalic and atraumatic. Eyes:  Sclera clear, no icterus.   Conjunctiva pink. Heart:  Regular rate and rhythm; no murmurs Abdomen:  Soft, nontender, normal bowel sounds. No rebound or guarding. No hepatosplenomegaly Rectal:  Deferred  Msk:  Symmetrical without gross deformities. Extremities:  No gross deformities or edema. Neurologic:  Alert and  oriented x4;  grossly nonfocal Skin:  No rash or bruise. Psych:  Alert and cooperative. Normal mood and affect.   Caedence Snowden L. Tarri Glenn, MD, MPH [facility] 09/14/2020, 2:44 PM

## 2020-09-14 NOTE — Patient Instructions (Signed)
If you are age 75 or older, your body mass index should be between 23-30. Your Body mass index is 28.94 kg/m. If this is out of the aforementioned range listed, please consider follow up with your Primary Care Provider.  If you are age 36 or younger, your body mass index should be between 19-25. Your Body mass index is 28.94 kg/m. If this is out of the aformentioned range listed, please consider follow up with your Primary Care Provider.   You have been scheduled for a colonoscopy. Please follow written instructions given to you at your visit today.  Please pick up your prep supplies at the pharmacy within the next 1-3 days. If you use inhalers (even only as needed), please bring them with you on the day of your procedure.  You will be contacted by our office prior to your procedure for directions on holding your Coumadin.  If you do not hear from our office 1 week prior to your scheduled procedure, please call [phone] to discuss.   Thank you for entrusting me with your care and for choosing [facility], Dr. Thornton Park

## 2020-09-15 NOTE — Telephone Encounter (Signed)
Received Faxed reply from Dr. Angelica Ran office that pt may hold coumadin for 5 days prior to procedure scheduled for 09-21-20.  Called pt.  He understands to hold Coumadin starting tomorrow, 10-8 and that Dr. Tarri Glenn will let him know when to resume it after the procedure on 09-21-20.

## 2020-09-20 ENCOUNTER — Encounter: Payer: Self-pay | Admitting: Gastroenterology

## 2020-09-20 ENCOUNTER — Encounter: Payer: Non-veteran care | Admitting: Gastroenterology

## 2020-09-20 ENCOUNTER — Other Ambulatory Visit: Payer: Self-pay

## 2020-09-20 ENCOUNTER — Ambulatory Visit (AMBULATORY_SURGERY_CENTER): Payer: No Typology Code available for payment source | Admitting: Gastroenterology

## 2020-09-20 VITALS — BP 126/67 | HR 72 | Temp 97.3°F | Resp 15 | Ht 69.0 in | Wt 196.0 lb

## 2020-09-20 DIAGNOSIS — Z86010 Personal history of colonic polyps: Secondary | ICD-10-CM | POA: Diagnosis not present

## 2020-09-20 DIAGNOSIS — D122 Benign neoplasm of ascending colon: Secondary | ICD-10-CM | POA: Diagnosis not present

## 2020-09-20 DIAGNOSIS — D124 Benign neoplasm of descending colon: Secondary | ICD-10-CM | POA: Diagnosis not present

## 2020-09-20 DIAGNOSIS — D123 Benign neoplasm of transverse colon: Secondary | ICD-10-CM

## 2020-09-20 DIAGNOSIS — K635 Polyp of colon: Secondary | ICD-10-CM | POA: Diagnosis not present

## 2020-09-20 HISTORY — PX: COLONOSCOPY: SHX174

## 2020-09-20 MED ORDER — SODIUM CHLORIDE 0.9 % IV SOLN: 500.0000 mL | Freq: Once | INTRAVENOUS | Status: DC

## 2020-09-20 NOTE — Patient Instructions (Addendum)
Handouts Provided:  Polyps and Diverticulosis  RESUME Coumadin at prior dose tomorrow (09/21/20)  YOU HAD AN ENDOSCOPIC PROCEDURE TODAY AT [facility]:   Refer to the procedure report that was given to you for any specific questions about what was found during the examination.  If the procedure report does not answer your questions, please call your gastroenterologist to clarify.  If you requested that your care partner not be given the details of your procedure findings, then the procedure report has been included in a sealed envelope for you to review at your convenience later.  YOU SHOULD EXPECT: Some feelings of bloating in the abdomen. Passage of more gas than usual.  Walking can help get rid of the air that was put into your GI tract during the procedure and reduce the bloating. If you had a lower endoscopy (such as a colonoscopy or flexible sigmoidoscopy) you may notice spotting of blood in your stool or on the toilet paper. If you underwent a bowel prep for your procedure, you may not have a normal bowel movement for a few days.  Please Note:  You might notice some irritation and congestion in your nose or some drainage.  This is from the oxygen used during your procedure.  There is no need for concern and it should clear up in a day or so.  SYMPTOMS TO REPORT IMMEDIATELY:   Following lower endoscopy (colonoscopy or flexible sigmoidoscopy):  Excessive amounts of blood in the stool  Significant tenderness or worsening of abdominal pains  Swelling of the abdomen that is new, acute  Fever of 100F or higher  For urgent or emergent issues, a gastroenterologist can be reached at any hour by calling [phone]. Do not use MyChart messaging for urgent concerns.    DIET:  We do recommend a small meal at first, but then you may proceed to your regular diet.  Drink plenty of fluids but you should avoid alcoholic beverages for 24 hours.  ACTIVITY:  You should plan to take  it easy for the rest of today and you should NOT DRIVE or use heavy machinery until tomorrow (because of the sedation medicines used during the test).    FOLLOW UP: Our staff will call the number listed on your records 48-72 hours following your procedure to check on you and address any questions or concerns that you may have regarding the information given to you following your procedure. If we do not reach you, we will leave a message.  We will attempt to reach you two times.  During this call, we will ask if you have developed any symptoms of COVID 19. If you develop any symptoms (ie: fever, flu-like symptoms, shortness of breath, cough etc.) before then, please call [phone].  If you test positive for Covid 19 in the 2 weeks post procedure, please call and report this information to [place].    If any biopsies were taken you will be contacted by phone or by letter within the next 1-3 weeks.  Please call us at [phone] if you have not heard about the biopsies in 3 weeks.    SIGNATURES/CONFIDENTIALITY: You and/or your care partner have signed paperwork which will be entered into your electronic medical record.  These signatures attest to the fact that that the information above on your After Visit Summary has been reviewed and is understood.  Full responsibility of the confidentiality of this discharge information lies with you and/or your care-partner.

## 2020-09-20 NOTE — Progress Notes (Signed)
Called to room to assist during endoscopic procedure.  Patient ID and intended procedure confirmed with present staff. Received instructions for my participation in the procedure from the performing physician.  

## 2020-09-20 NOTE — Progress Notes (Signed)
Patient was getting dressed and had a fall.  He hit his head on the oxygen tank but states he is fine and no pain.  No visible signs of a hematoma are present.    Patient was assisted to chair and Dr. Tarri Glenn has been notified.   Dr. Tarri Glenn has released patient for discharge per patient's request.

## 2020-09-20 NOTE — Progress Notes (Signed)
Vital signs checked by:[facility]  The medical and surgical history was reviewed and verified with the patient.

## 2020-09-20 NOTE — Op Note (Signed)
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                           History of colon polyps 2020                           - 11 tubular adenomas, 2 hyperplastic polyps                            02/11/2019                           - 3 tubular adenomas, 1 hyperplastic polyp                            10/21/2019                           On chronic warfarin for coronary artery stents Medicines:                Monitored Anesthesia Care Procedure:                Pre-Anesthesia Assessment:                           - Prior to the procedure, a History and Physical                            was performed, and patient medications and                            allergies were reviewed. The patient's tolerance of                            previous anesthesia was also reviewed. The risks                            and benefits of the procedure and the sedation                            options and risks were discussed with the patient.  All questions were answered, and informed consent                            was obtained. Prior Anticoagulants: The patient has                            taken Coumadin (warfarin), last dose was 5 days                            prior to procedure. ASA Grade Assessment: III - A                            patient with severe systemic disease. After                            reviewing the risks and benefits, the patient was                             deemed in satisfactory condition to undergo the                            procedure.                           After obtaining informed consent, the colonoscope                            was passed under direct vision. Throughout the                            procedure, the patient's blood pressure, pulse, and                            oxygen saturations were monitored continuously. The                            Colonoscope was introduced through the anus and                            advanced to the the cecum, identified by                            appendiceal orifice and ileocecal valve. The                            colonoscopy was technically difficult and complex                            due to a redundant colon, significant looping and a                            tortuous colon. Successful completion of the  procedure was aided by changing the patient's                            position and applying abdominal pressure. The                            patient tolerated the procedure well. The quality                            of the bowel preparation was good. The ileocecal                            valve, appendiceal orifice, and rectum were                            photographed. Scope In: 9:59:45 AM Scope Out: 10:25:25 AM Scope Withdrawal Time: 0 hours 17 minutes 30 seconds  Total Procedure Duration: 0 hours 25 minutes 40 seconds  Findings:                 The perianal and digital rectal examinations were                            normal.                           Multiple small and large-mouthed diverticula were                            found in the sigmoid colon and descending colon.                           A 3 mm polyp was found in the descending colon. The                            polyp was flat and on the back side of a fold. The                            polyp was removed with a cold snare. Resection and                             retrieval were complete. Estimated blood loss was                            minimal.                           A less than 1 mm polyp was found in the hepatic                            flexure. The polyp was flat. The polyp was removed                            with a cold biopsy forceps. Resection and retrieval  were complete. Estimated blood loss was minimal.                           A 2 mm polyp was found in the hepatic flexure. The                            polyp was flat and hidden within a fold. The polyp                            was removed with a cold snare. Resection and                            retrieval were complete. Estimated blood loss was                            minimal.                           A 2 mm polyp was found in the ascending colon. The                            polyp was flat and on the back side of a fold. The                            polyp was removed with a cold snare. Resection and                            retrieval were complete. Estimated blood loss was                            minimal.                           The exam was otherwise without abnormality on                            direct and retroflexion views. Complications:            No immediate complications. Estimated blood loss:                            Minimal. Estimated Blood Loss:     Estimated blood loss was minimal. Impression:               - Diverticulosis in the sigmoid colon and in the                            descending colon.                           - One 3 mm polyp in the descending colon, removed                            with a cold snare. Resected and retrieved.                           -  One less than 1 mm polyp at the hepatic flexure,                            removed with a cold biopsy forceps. Resected and                            retrieved.                           - One 2 mm polyp at the hepatic flexure, removed                             with a cold snare. Resected and retrieved.                           - One 2 mm polyp in the ascending colon, removed                            with a cold snare. Resected and retrieved.                           - No residual polyp seen at the location of prior                            IC valve polyp. The examination was otherwise                            normal on direct and retroflexion views. Recommendation:           - Patient has a contact number available for                            emergencies. The signs and symptoms of potential                            delayed complications were discussed with the                            patient. Return to normal activities tomorrow.                            Written discharge instructions were provided to the                            patient.                           - High fiber diet.                           - Resume Coumadin (warfarin) at prior dose tomorrow.                           - Await pathology results.                           -  Repeat colonoscopy date to be determined after                            pending pathology results are reviewed for                            surveillance.                           - Emerging evidence supports eating a diet of                            fruits, vegetables, grains, calcium, and yogurt                            while reducing red meat and alcohol may reduce the                            risk of colon cancer.                           - Thank you for allowing me to be involved in your                            colon cancer prevention. Thornton Park MD, MD 09/20/2020 10:37:18 AM This report has been signed electronically. Adaline Sill,

## 2020-09-20 NOTE — Progress Notes (Signed)
Report to PACU, RN, vss, BBS= Clear.  

## 2020-09-21 ENCOUNTER — Encounter: Payer: Non-veteran care | Admitting: Gastroenterology

## 2020-09-22 ENCOUNTER — Telehealth: Payer: Self-pay

## 2020-09-22 NOTE — Telephone Encounter (Signed)
  Follow up Call-  Call back number 09/20/2020 10/21/2019 02/10/2019  Post procedure Call Back phone  # [phone]  Permission to leave phone message Yes Yes Yes  Some recent data might be hidden     Patient questions:  Do you have a fever, pain , or abdominal swelling? No. Pain Score  0 *  Have you tolerated food without any problems? Yes.    Have you been able to return to your normal activities? Yes.    Do you have any questions about your discharge instructions: Diet   No. Medications  No. Follow up visit  No.  Do you have questions or concerns about your Care? No.  Actions: * If pain score is 4 or above: No action needed, pain <4. 1. Have you developed a fever since your procedure? no  2.   Have you had an respiratory symptoms (SOB or cough) since your procedure? no  3.   Have you tested positive for COVID 19 since your procedure no  4.   Have you had any family members/close contacts diagnosed with the COVID 19 since your procedure?  no   If yes to any of these questions please route to Joylene John, RN and Joella Prince, RN

## 2020-09-25 ENCOUNTER — Encounter: Payer: Self-pay | Admitting: Gastroenterology

## 2022-12-31 ENCOUNTER — Ambulatory Visit: Payer: No Typology Code available for payment source | Admitting: Gastroenterology

## 2023-01-10 DEATH — deceased
